# Patient Record
Sex: Male | Born: 1935 | Race: White | Hispanic: No | Marital: Married | Smoking: Former smoker
Health system: Southern US, Community
[De-identification: ages and names within clinical notes are randomized; demographics above are authoritative.]

## PROBLEM LIST (undated history)

## (undated) DIAGNOSIS — E119 Type 2 diabetes mellitus without complications: Secondary | ICD-10-CM

## (undated) DIAGNOSIS — I350 Nonrheumatic aortic (valve) stenosis: Secondary | ICD-10-CM

## (undated) DIAGNOSIS — M109 Gout, unspecified: Secondary | ICD-10-CM

## (undated) DIAGNOSIS — K219 Gastro-esophageal reflux disease without esophagitis: Secondary | ICD-10-CM

## (undated) DIAGNOSIS — I10 Essential (primary) hypertension: Secondary | ICD-10-CM

## (undated) HISTORY — DX: Gout, unspecified: M10.9

## (undated) HISTORY — PX: JOINT REPLACEMENT: SHX530

## (undated) HISTORY — DX: Essential (primary) hypertension: I10

## (undated) HISTORY — DX: Gastro-esophageal reflux disease without esophagitis: K21.9

## (undated) HISTORY — DX: Nonrheumatic aortic (valve) stenosis: I35.0

## (undated) HISTORY — DX: Type 2 diabetes mellitus without complications: E11.9

---

## 1998-01-08 ENCOUNTER — Encounter: Admission: RE | Admit: 1998-01-08 | Discharge: 1998-04-08 | Payer: Self-pay | Admitting: *Deleted

## 2009-09-27 ENCOUNTER — Ambulatory Visit (HOSPITAL_COMMUNITY): Admission: RE | Admit: 2009-09-27 | Discharge: 2009-09-27 | Payer: Self-pay | Admitting: Ophthalmology

## 2009-10-25 ENCOUNTER — Ambulatory Visit (HOSPITAL_COMMUNITY): Admission: RE | Admit: 2009-10-25 | Discharge: 2009-10-25 | Payer: Self-pay | Admitting: Ophthalmology

## 2010-05-18 LAB — CBC
HCT: 41.7 % (ref 39.0–52.0)
MCH: 33.4 pg (ref 26.0–34.0)
MCV: 98.3 fL (ref 78.0–100.0)
Platelets: 193 10*3/uL (ref 150–400)
RDW: 13.8 % (ref 11.5–15.5)
WBC: 6.6 10*3/uL (ref 4.0–10.5)

## 2010-05-18 LAB — BASIC METABOLIC PANEL
BUN: 23 mg/dL (ref 6–23)
CO2: 26 mEq/L (ref 19–32)
Chloride: 101 mEq/L (ref 96–112)
Creatinine, Ser: 1.18 mg/dL (ref 0.4–1.5)
Glucose, Bld: 170 mg/dL — ABNORMAL HIGH (ref 70–99)
Potassium: 3.8 mEq/L (ref 3.5–5.1)

## 2010-05-18 LAB — DIFFERENTIAL
Basophils Absolute: 0.1 10*3/uL (ref 0.0–0.1)
Eosinophils Absolute: 0.2 10*3/uL (ref 0.0–0.7)
Eosinophils Relative: 2 % (ref 0–5)
Lymphocytes Relative: 40 % (ref 12–46)
Lymphs Abs: 2.6 10*3/uL (ref 0.7–4.0)
Monocytes Absolute: 0.5 10*3/uL (ref 0.1–1.0)

## 2010-05-18 LAB — GLUCOSE, CAPILLARY: Glucose-Capillary: 130 mg/dL — ABNORMAL HIGH (ref 70–99)

## 2011-04-15 ENCOUNTER — Other Ambulatory Visit: Payer: Self-pay

## 2011-11-24 ENCOUNTER — Encounter: Payer: Self-pay | Admitting: Internal Medicine

## 2011-12-04 ENCOUNTER — Encounter: Payer: Self-pay | Admitting: *Deleted

## 2011-12-15 ENCOUNTER — Ambulatory Visit (INDEPENDENT_AMBULATORY_CARE_PROVIDER_SITE_OTHER): Payer: Medicare Other | Admitting: Cardiology

## 2011-12-15 ENCOUNTER — Encounter: Payer: Self-pay | Admitting: Cardiology

## 2011-12-15 ENCOUNTER — Encounter: Payer: Self-pay | Admitting: *Deleted

## 2011-12-15 VITALS — BP 153/80 | HR 57 | Ht 70.0 in | Wt 210.0 lb

## 2011-12-15 DIAGNOSIS — I429 Cardiomyopathy, unspecified: Secondary | ICD-10-CM

## 2011-12-15 DIAGNOSIS — I10 Essential (primary) hypertension: Secondary | ICD-10-CM | POA: Insufficient documentation

## 2011-12-15 DIAGNOSIS — I350 Nonrheumatic aortic (valve) stenosis: Secondary | ICD-10-CM

## 2011-12-15 DIAGNOSIS — I359 Nonrheumatic aortic valve disorder, unspecified: Secondary | ICD-10-CM

## 2011-12-15 DIAGNOSIS — I428 Other cardiomyopathies: Secondary | ICD-10-CM

## 2011-12-15 MED ORDER — CARVEDILOL 12.5 MG PO TABS: 12.5000 mg | ORAL_TABLET | Freq: Two times a day (BID) | ORAL | Status: DC

## 2011-12-15 NOTE — Patient Instructions (Addendum)
   TEE - patient to call back after discussing with son   Stop Metoprolol  Change to Coreg 12.5mg  twice a day   Follow up in  2 weeks

## 2011-12-15 NOTE — Progress Notes (Signed)
Patient ID: NASEEM VARDEN, male   DOB: October 31, 1930, 78 y.o.   MRN: [id number] PCP: Dr. Sherryll Burger  78 yo with history of DM, HTN, and hyperlipidemia presents for evaluation of aortic stenosis.  Patient had an echo at PCP's office in 9/13 with question of severe AS and possibly decreased LV systolic function.  I do not have the images available to review.  AS is described as visually severe but calculated valve area was 1.2 cm^2 and mean gradient was 17 mmHg only.  Patient is not sure why he had the echo done but it may have been because of aortic-area murmur.    Mr Schappell main complaint is that he has felt "sick" for a long time.  He cannot describe this symptom well, but it seems like he has had nausea on and off.  He denies exertional dyspnea or chest pain.  He can climb a flight of steps without problems and has no problems walking on flat ground.  He does not, however, seem to be particularly active.  No lightheadedness or syncope.  No palpitations.  He has been told for a while that he has a heart murmur.  He has no prior history of MI, etc.   Labs (2/13): K 3.6, creatinine 1.26, LDL 46, HDL 34, HCT 44.5  ECG: NSR, old inferior MI, left axis deviation, LAE  PMH: 1. Aortic stenosis: Echo in 9/13 at outside facility showed EF 35-45%, AS described as severe visually but AVA 1.2 cm^2 and mean gradient only 17 mmHg.  2. Obesity.  3. Gout 4. HTN 5. Type II diabetes 6. GERD  FH: no cardiac problems that he knows of.   SH: Lives in [place].  Nonsmoker, married, retired from [employer].   ROS: All systems reviewed and negative except as per HPI.   Current Outpatient Prescriptions  Medication Sig Dispense Refill  . allopurinol (ZYLOPRIM) 300 MG tablet Take 300 mg by mouth daily.      Marland Kitchen ALPRAZolam (XANAX) 0.5 MG tablet Take 0.5 mg by mouth 2 (two) times daily as needed.      Marland Kitchen amLODipine (NORVASC) 10 MG tablet Take 10 mg by mouth daily.      Marland Kitchen aspirin EC 81 MG tablet Take 81 mg by mouth daily.      Marland Kitchen  gabapentin (NEURONTIN) 100 MG capsule Take 100 mg by mouth 3 (three) times daily.      Marland Kitchen glimepiride (AMARYL) 2 MG tablet Take 2 mg by mouth 2 (two) times daily.      . hydrochlorothiazide (HYDRODIURIL) 25 MG tablet Take 25 mg by mouth daily.      Marland Kitchen lisinopril (PRINIVIL,ZESTRIL) 20 MG tablet Take 20 mg by mouth daily.      . metFORMIN (GLUCOPHAGE) 500 MG tablet Take 1,000 mg by mouth 2 (two) times daily with a meal.       . Misc Natural Products (OSTEO BI-FLEX ADV DOUBLE ST PO) Take 1 tablet by mouth 2 (two) times daily.      Marland Kitchen omeprazole (PRILOSEC) 20 MG capsule Take 1 capsule by mouth Daily.      . pravastatin (PRAVACHOL) 40 MG tablet Take 40 mg by mouth daily.      . carvedilol (COREG) 12.5 MG tablet Take 1 tablet (12.5 mg total) by mouth 2 (two) times daily.  60 tablet  6    BP 153/80  Pulse 57  Ht 5\' 10"  (1.778 m)  Wt 210 lb (95.255 kg)  BMI 30.13 kg/m2 General: NAD Neck:  No JVD, no thyromegaly or thyroid nodule.  Lungs: Clear to auscultation bilaterally with normal respiratory effort. CV: Nondisplaced PMI.  Heart regular S1/S2, no S3/S4, 3/6 crescendo-decrescendo systolic murmur RUSB, can hear S2 with slight muffling.  Trace ankle edema.  No carotid bruit.  Normal pedal pulses.  Abdomen: Soft, nontender, no hepatosplenomegaly, no distention.  Skin: Intact without lesions or rashes.  Neurologic: Alert and oriented x 3.  Psych: Normal affect. Extremities: No clubbing or cyanosis.  HEENT: Normal.   Assessment/Plan:  1. Aortic stenosis: Echo done at PCP's office.  I do not have images available.  Per report, seems somewhat equivocal with severe stenosis visually but doppler data does not seem to correspond.  He has a significant murmur on exam.  He does not report any particularly marked exertional symptoms, but I do not think that he is particularly active.  I think that we need to investigate the AS more closely.  I will arrange for a TEE with possible additional TTE images to see if  AS is truly severe.  2. Cardiomyopathy: EF 35-45% by report from outside echo.  Will reassess this with TEE and possible additional TTE images.  He does not report ischemic symptoms.  He does not appear significantly volume overloaded on exam.  If EF is indeed depressed, it could be due to severe AS (if AS is indeed severe), but also could be due to CAD.  He will need an ischemic workup.  Will wait for TEE and further AS workup before deciding on cath versus myoview.  - Continue ACEI (lisinopril).  - Stop metoprolol and instead use Coreg 12.5 mg bid (should be better for BP also).   3. HTN: As above, replacing metoprolol with Coreg.   Marca Ancona 12/15/2011 4:04 PM

## 2012-01-14 ENCOUNTER — Encounter: Payer: Self-pay | Admitting: *Deleted

## 2012-01-14 ENCOUNTER — Telehealth: Payer: Self-pay | Admitting: *Deleted

## 2012-01-14 ENCOUNTER — Telehealth: Payer: Self-pay

## 2012-01-14 NOTE — Telephone Encounter (Signed)
Pt has [employer], no precert for TEE

## 2012-01-14 NOTE — Progress Notes (Signed)
Patient ID: Shaun Whitney, male   DOB: 01-22-1931, 78 y.o.   MRN: [id number]   TEE - scheduled for Thursday, November 14 at 9:00 with Dr. Elease Hashimoto.  Patient to arrive to Short Stay at [facility] at 8:00.     Booking Number - [id number].

## 2012-01-14 NOTE — Telephone Encounter (Signed)
TEE scheduled for Thursday, November 14 at 9:00 am with Dr. Elease Hashimoto.  Patient to arrive to Short Stay at [facility] at 8:00 am.    Multiple attempts made to schedule with Dr. Shirlee Latch, but unsuccessful due to patient working out transportation with son & his work schedule & trying to coordinate with MD schedule.    Patient aware of above with instructions given.

## 2012-01-14 NOTE — Telephone Encounter (Signed)
I am in the hospital tomorrow (11/14).  I can do the TEE.

## 2012-01-14 NOTE — Telephone Encounter (Signed)
TEE scheduled for tomorrow, Thursday, 11/14 at 9:00 - [facility] - Dr. Elease Hashimoto.

## 2012-01-14 NOTE — Telephone Encounter (Signed)
Message copied by Lesle Chris on Wed Jan 14, 2012 11:29 AM ------      Message from: Laurey Morale      Created: Fri Dec 19, 2011  5:37 PM       Could have done it at lunch but scheduled a meeting.  How about Friday when I am in [place]?       [place]      ----- Message -----         From: Lesle Chris, LPN         Sent: 12/19/2011   4:22 PM           To: Laurey Morale, MD            Patient last seen on 10/14 - just called back with date his son could take him to [facility] for his TEE.  He is asking for Tuesday, 10/22.  Doctors schedule has you down for [employer] Rounds.              Not sure how that affects your schedule.  Is this a clinic schedule or hospital time where you could fit in procedure for the evening?            Thanks,            Inocencio Homes

## 2012-01-15 ENCOUNTER — Ambulatory Visit (HOSPITAL_COMMUNITY)
Admission: RE | Admit: 2012-01-15 | Discharge: 2012-01-15 | Disposition: A | Discharge status: Home / Self Care | Payer: Medicare Other | Source: Ambulatory Visit | Attending: Cardiovascular Disease | Admitting: Cardiovascular Disease

## 2012-01-15 ENCOUNTER — Encounter (HOSPITAL_COMMUNITY)
Admission: RE | Disposition: A | Discharge status: Home / Self Care | Payer: Self-pay | Source: Ambulatory Visit | Attending: Cardiovascular Disease

## 2012-01-15 ENCOUNTER — Encounter (HOSPITAL_COMMUNITY): Payer: Self-pay

## 2012-01-15 DIAGNOSIS — I359 Nonrheumatic aortic valve disorder, unspecified: Secondary | ICD-10-CM

## 2012-01-15 DIAGNOSIS — I350 Nonrheumatic aortic (valve) stenosis: Secondary | ICD-10-CM

## 2012-01-15 HISTORY — PX: TEE WITHOUT CARDIOVERSION: SHX5443

## 2012-01-15 LAB — GLUCOSE, CAPILLARY: Glucose-Capillary: 143 mg/dL — ABNORMAL HIGH (ref 70–99)

## 2012-01-15 SURGERY — ECHOCARDIOGRAM, TRANSESOPHAGEAL
Anesthesia: Moderate Sedation

## 2012-01-15 MED ORDER — HYDRALAZINE HCL 20 MG/ML IJ SOLN
INTRAMUSCULAR | Status: DC | PRN
  Administered 2012-01-15: 20 mg via INTRAVENOUS

## 2012-01-15 MED ORDER — AMLODIPINE BESYLATE 10 MG PO TABS
10.0000 mg | ORAL_TABLET | Freq: Every day | ORAL | Status: DC
  Administered 2012-01-15: 10 mg via ORAL
  Filled 2012-01-15 (×2): qty 1

## 2012-01-15 MED ORDER — LISINOPRIL 20 MG PO TABS
20.0000 mg | ORAL_TABLET | Freq: Once | ORAL | Status: AC
  Administered 2012-01-15: 20 mg via ORAL
  Filled 2012-01-15 (×2): qty 1

## 2012-01-15 MED ORDER — METOPROLOL TARTRATE 1 MG/ML IV SOLN
5.0000 mg | Freq: Once | INTRAVENOUS | Status: AC
  Administered 2012-01-15: 5 mg via INTRAVENOUS

## 2012-01-15 MED ORDER — FENTANYL CITRATE 0.05 MG/ML IJ SOLN
INTRAMUSCULAR | Status: DC | PRN
  Administered 2012-01-15 (×2): 25 ug via INTRAVENOUS

## 2012-01-15 MED ORDER — BUTAMBEN-TETRACAINE-BENZOCAINE 2-2-14 % EX AERO
INHALATION_SPRAY | CUTANEOUS | Status: DC | PRN
  Administered 2012-01-15: 2 via TOPICAL

## 2012-01-15 MED ORDER — METOPROLOL TARTRATE 1 MG/ML IV SOLN
INTRAVENOUS | Status: AC
  Filled 2012-01-15: qty 5

## 2012-01-15 MED ORDER — SODIUM CHLORIDE 0.9 % IV SOLN
INTRAVENOUS | Status: DC
  Administered 2012-01-15: 500 mL via INTRAVENOUS

## 2012-01-15 MED ORDER — MIDAZOLAM HCL 10 MG/2ML IJ SOLN
INTRAMUSCULAR | Status: DC | PRN
  Administered 2012-01-15: 1 mg via INTRAVENOUS
  Administered 2012-01-15: 2 mg via INTRAVENOUS

## 2012-01-15 MED ORDER — FENTANYL CITRATE 0.05 MG/ML IJ SOLN
INTRAMUSCULAR | Status: AC
  Filled 2012-01-15: qty 2

## 2012-01-15 MED ORDER — CARVEDILOL 12.5 MG PO TABS
12.5000 mg | ORAL_TABLET | Freq: Once | ORAL | Status: AC
  Administered 2012-01-15: 12.5 mg via ORAL
  Filled 2012-01-15 (×2): qty 1

## 2012-01-15 MED ORDER — METOPROLOL TARTRATE 1 MG/ML IV SOLN
INTRAVENOUS | Status: DC | PRN
  Administered 2012-01-15: 5 mg via INTRAVENOUS

## 2012-01-15 MED ORDER — HYDRALAZINE HCL 20 MG/ML IJ SOLN
INTRAMUSCULAR | Status: AC
  Filled 2012-01-15: qty 1

## 2012-01-15 MED ORDER — MIDAZOLAM HCL 5 MG/ML IJ SOLN
INTRAMUSCULAR | Status: AC
  Filled 2012-01-15: qty 2

## 2012-01-15 NOTE — H&P (Signed)
   Patient arrived today for TEE.  No change to history or physical compared to note from 12/15/11.   Marca Ancona 01/15/2012 9:19 AM

## 2012-01-15 NOTE — Progress Notes (Signed)
Echocardiogram Echocardiogram Transesophageal has been performed.  Shaun Whitney 01/15/2012, 10:33 AM

## 2012-01-15 NOTE — CV Procedure (Addendum)
Procedure: TEE  Indication: Aortic stenosis  Sedation: Versed 3 mg IV, Fentanyl 50 mcg IV  Findings: Please see echo section of chart for full report.  Normal LV size with mild to moderate LV hypertrophy.  EF 55% with no definite wall motion abnormalities.  By valve area and mean gradient, there was mild aortic stenosis.  Visually, stenosis looked a bit worse, perhaps moderate range.   The valve was trileaflet.   No complications.   I will arrange a Lexiscan myoview to further assess his exercise intolerance.   Marca Ancona 01/15/2012 9:59 AM

## 2012-01-16 ENCOUNTER — Encounter (HOSPITAL_COMMUNITY): Payer: Self-pay | Admitting: Cardiology

## 2012-01-21 ENCOUNTER — Telehealth: Payer: Self-pay | Admitting: *Deleted

## 2012-01-21 NOTE — Telephone Encounter (Signed)
Left message with male at residence.  Stated he was outside - getting ready to go to appointment with Dr. Sherryll Burger.  She will have him return call.

## 2012-01-21 NOTE — Telephone Encounter (Signed)
Message copied by Lesle Chris on Wed Jan 21, 2012  9:50 AM ------      Message from: Laurey Morale      Created: Thu Jan 15, 2012  9:59 AM       Mr Crumble needs a Steffanie Dunn for exertional dyspnea.  Please arrange for this.  Can do in [place] if he can make it here, otherwise Ambulance person (prefer [place]).       Thanks.

## 2012-02-02 NOTE — Telephone Encounter (Signed)
Attempted to reach patient again - spoke with wife Shaun Whitney) - states husband has gone to drug store.  Stated she did give him the message before & will talk with him when he returns.  Will have him call back if return before 5:00 today or tomorrow morning.

## 2012-02-09 NOTE — Telephone Encounter (Signed)
Discussed below with patient.  States he does not want to do this test now.  Explained to patient the reason MD wanted to do this test, but he still declines at this time.  Advised patient that I will inform Dr. Shirlee Latch and see when he needs to be seen back in office for follow up.  Patient verbalized understanding.

## 2012-02-09 NOTE — Telephone Encounter (Signed)
Have him return in 2-3 weeks if symptoms remain stable.

## 2012-02-11 NOTE — Telephone Encounter (Signed)
Discussed below with patient.  Scheduled OV with Dr. Shirlee Latch tomorrow as MD not here within time frame suggested below.

## 2012-02-12 ENCOUNTER — Encounter: Payer: Self-pay | Admitting: Cardiology

## 2012-02-12 ENCOUNTER — Ambulatory Visit (INDEPENDENT_AMBULATORY_CARE_PROVIDER_SITE_OTHER): Payer: Medicare Other | Admitting: Cardiology

## 2012-02-12 VITALS — BP 153/68 | HR 62 | Ht 68.0 in | Wt 218.0 lb

## 2012-02-12 DIAGNOSIS — E785 Hyperlipidemia, unspecified: Secondary | ICD-10-CM

## 2012-02-12 DIAGNOSIS — R0602 Shortness of breath: Secondary | ICD-10-CM

## 2012-02-12 DIAGNOSIS — I350 Nonrheumatic aortic (valve) stenosis: Secondary | ICD-10-CM

## 2012-02-12 DIAGNOSIS — I10 Essential (primary) hypertension: Secondary | ICD-10-CM

## 2012-02-12 DIAGNOSIS — I359 Nonrheumatic aortic valve disorder, unspecified: Secondary | ICD-10-CM

## 2012-02-12 MED ORDER — LISINOPRIL 40 MG PO TABS: 40.0000 mg | ORAL_TABLET | Freq: Every day | ORAL | Status: AC

## 2012-02-12 NOTE — Patient Instructions (Addendum)
Your physician recommends that you schedule a follow-up appointment in: 6 months. You will receive a reminder letter in the mail in about 4 months reminding you to call and schedule your appointment. If you don't receive this letter, please contact our office.  Your physician has recommended you make the following change in your medication: Increased lisinopril to 40 mg daily. Please take (2) of your 20 mg tablets daily until they are finished. Your new prescription has been sent to your pharmacy. All other medications will remain the same. Your physician recommends that you return for a FASTING lipid profile and BMET in 2 weeks around 02/26/12 at [facility]. Please don't eat or drink after midnight. Your physician has requested that you have an echocardiogram in 1 year. Echocardiography is a painless test that uses sound waves to create images of your heart. It provides your doctor with information about the size and shape of your heart and how well your heart's chambers and valves are working. This procedure takes approximately one hour. There are no restrictions for this procedure.

## 2012-02-12 NOTE — Progress Notes (Signed)
Patient ID: Shaun Whitney, male   DOB: August 16, 1930, 78 y.o.   MRN: [id number] PCP: Dr. Sherryll Burger  78 yo with history of DM, HTN, and hyperlipidemia presented initially for evaluation of aortic stenosis.  Patient had an echo at PCP's office in 9/13 with question of severe AS and possibly decreased LV systolic function.  I do not have the images available to review.  AS is described as visually severe but calculated valve area was 1.2 cm^2 and mean gradient was 17 mmHg only.  EF was described as 35-45%.  Patient is not sure why he had the echo done but it may have been because of aortic-area murmur.    Mr Gartin main complaint at last appointment is that he has felt "sick" for a long time.  He could not describe this symptom well, but it seems like he has had nausea on and off.  He denies exertional dyspnea or chest pain.  He can climb a flight of steps without problems and has no problems walking on flat ground.  He does not, however, seem to be particularly active.  No lightheadedness or syncope.  No palpitations.  He has been told for a while that he has a heart murmur.  He has no prior history of MI, etc.   I did a TEE in 11/13.  This showed normal EF (55%) with mild AS by valve area and mean gradient and perhaps moderate AS visually.  Today, he actually says that the "sick" feeling has improved.  Still no exertional symptoms.  BP continues to run high.   Labs (2/13): K 3.6, creatinine 1.26, LDL 46, HDL 34, HCT 44.5  ECG: NSR, old inferior MI, left axis deviation, LAE  PMH: 1. Aortic stenosis: Echo in 9/13 at outside facility showed EF 35-45%, AS described as severe visually but AVA 1.2 cm^2 and mean gradient only 17 mmHg.  TEE (11/13) with EF 55%, mild-moderate LVH, mild AS by gradient and valve area (mean 18 mmHg, AVA 1.74 cm^2) but moderate visually.  2. Obesity.  3. Gout 4. HTN 5. Type II diabetes 6. GERD  FH: no cardiac problems that he knows of.   SH: Lives in [place].  Nonsmoker, married,  retired from [employer].   ROS: All systems reviewed and negative except as per HPI.   Current Outpatient Prescriptions  Medication Sig Dispense Refill  . allopurinol (ZYLOPRIM) 300 MG tablet Take 300 mg by mouth daily.      Marland Kitchen ALPRAZolam (XANAX) 0.5 MG tablet Take 0.5 mg by mouth 2 (two) times daily as needed.      Marland Kitchen amLODipine (NORVASC) 10 MG tablet Take 10 mg by mouth daily.      Marland Kitchen aspirin EC 81 MG tablet Take 81 mg by mouth daily.      . carvedilol (COREG) 12.5 MG tablet Take 1 tablet (12.5 mg total) by mouth 2 (two) times daily.  60 tablet  6  . gabapentin (NEURONTIN) 100 MG capsule Take 100 mg by mouth 3 (three) times daily.      Marland Kitchen glimepiride (AMARYL) 2 MG tablet Take 2 mg by mouth 2 (two) times daily.      . hydrochlorothiazide (HYDRODIURIL) 25 MG tablet Take 25 mg by mouth daily.      . metFORMIN (GLUCOPHAGE) 500 MG tablet Take 1,000 mg by mouth 2 (two) times daily with a meal.       . Misc Natural Products (OSTEO BI-FLEX ADV DOUBLE ST PO) Take 1 tablet by mouth 2 (  two) times daily.      Marland Kitchen omeprazole (PRILOSEC) 20 MG capsule Take 1 capsule by mouth Daily.      . pravastatin (PRAVACHOL) 40 MG tablet Take 40 mg by mouth daily.      Marland Kitchen lisinopril (PRINIVIL,ZESTRIL) 40 MG tablet Take 1 tablet (40 mg total) by mouth daily.  90 tablet  3      BP 153/68  Pulse 62  Ht 5\' 8"  (1.727 m)  Wt 218 lb (98.884 kg)  BMI 33.15 kg/m2 General: NAD Neck: No JVD, no thyromegaly or thyroid nodule.  Lungs: Clear to auscultation bilaterally with normal respiratory effort. CV: Nondisplaced PMI.  Heart regular S1/S2, no S3/S4, 3/6 crescendo-decrescendo systolic murmur RUSB, can hear S2 with slight muffling.  Trace ankle edema.  No carotid bruit.  Normal pedal pulses.  Abdomen: Soft, nontender, no hepatosplenomegaly, no distention.  Skin: Intact without lesions or rashes.  Neurologic: Alert and oriented x 3.  Psych: Normal affect. Extremities: No clubbing or cyanosis.  HEENT: Normal.    Assessment/Plan:  1. Aortic stenosis: Mild to moderate AS confirmed by TEE.  No symptoms suggestive of worse AS.  Continue to monitor, will likely repeat echo in a year.   2. Cardiomyopathy: EF 35-45% by report from outside echo.  However, by TEE EF appeared normal.   3. HTN: BP still high.  Increase lisinopril to 40 mg daily. BMET in 2 wks, will also check lipids.   Marca Ancona 02/15/2012

## 2012-03-01 ENCOUNTER — Encounter: Payer: Self-pay | Admitting: Physician Assistant

## 2012-03-01 ENCOUNTER — Ambulatory Visit (INDEPENDENT_AMBULATORY_CARE_PROVIDER_SITE_OTHER): Payer: Medicare Other | Admitting: Physician Assistant

## 2012-03-01 VITALS — BP 158/80 | HR 72 | Ht 69.0 in | Wt 230.0 lb

## 2012-03-01 DIAGNOSIS — R0600 Dyspnea, unspecified: Secondary | ICD-10-CM | POA: Insufficient documentation

## 2012-03-01 DIAGNOSIS — R0602 Shortness of breath: Secondary | ICD-10-CM

## 2012-03-01 DIAGNOSIS — I10 Essential (primary) hypertension: Secondary | ICD-10-CM

## 2012-03-01 DIAGNOSIS — R0989 Other specified symptoms and signs involving the circulatory and respiratory systems: Secondary | ICD-10-CM

## 2012-03-01 DIAGNOSIS — I350 Nonrheumatic aortic (valve) stenosis: Secondary | ICD-10-CM

## 2012-03-01 DIAGNOSIS — I359 Nonrheumatic aortic valve disorder, unspecified: Secondary | ICD-10-CM

## 2012-03-01 DIAGNOSIS — Z79899 Other long term (current) drug therapy: Secondary | ICD-10-CM

## 2012-03-01 MED ORDER — FUROSEMIDE 40 MG PO TABS: 40.0000 mg | ORAL_TABLET | Freq: Every day | ORAL | Status: DC

## 2012-03-01 NOTE — Progress Notes (Signed)
Primary Cardiologist: Marca Ancona, MD   HPI: Shaun Whitney walked into office this a.m. with SOB, now seen as an add-on.  Shaun Whitney states that he was in his USOH until early Sunday morning, when he awoke SOB. He has remained SOB with significant DOE, since then. He denies any CP or palpitations. He has gained 12 pounds since last OV. He does admit to drinking a lot of fluids, and his wife does use salt in preparing his meals.   12-lead EKG today indicates NSR 72 bpm with poor R-wave progression; no acute changes  No Known Allergies  Current Outpatient Prescriptions  Medication Sig Dispense Refill  . allopurinol (ZYLOPRIM) 300 MG tablet Take 300 mg by mouth daily.      Marland Kitchen ALPRAZolam (XANAX) 0.5 MG tablet Take 0.5 mg by mouth 2 (two) times daily as needed.      Marland Kitchen amLODipine (NORVASC) 10 MG tablet Take 10 mg by mouth daily.      Marland Kitchen aspirin EC 81 MG tablet Take 81 mg by mouth daily.      . carvedilol (COREG) 12.5 MG tablet Take 1 tablet (12.5 mg total) by mouth 2 (two) times daily.  60 tablet  6  . gabapentin (NEURONTIN) 100 MG capsule Take 100 mg by mouth 3 (three) times daily.      Marland Kitchen glimepiride (AMARYL) 2 MG tablet Take 2 mg by mouth 2 (two) times daily.      . hydrochlorothiazide (HYDRODIURIL) 25 MG tablet Take 25 mg by mouth daily.      Marland Kitchen lisinopril (PRINIVIL,ZESTRIL) 40 MG tablet Take 1 tablet (40 mg total) by mouth daily.  90 tablet  3  . metFORMIN (GLUCOPHAGE) 500 MG tablet Take 1,000 mg by mouth 2 (two) times daily with a meal.       . Misc Natural Products (OSTEO BI-FLEX ADV DOUBLE ST PO) Take 1 tablet by mouth 2 (two) times daily.      Marland Kitchen omeprazole (PRILOSEC) 20 MG capsule Take 1 capsule by mouth Daily.      . pravastatin (PRAVACHOL) 40 MG tablet Take 40 mg by mouth daily.      . furosemide (LASIX) 40 MG tablet Take 1 tablet (40 mg total) by mouth daily.  30 tablet  6    Past Medical History  Diagnosis Date  . Aortic stenosis   . Diabetes mellitus type II   . Hypertension   . Gout    . GERD (gastroesophageal reflux disease)     Past Surgical History  Procedure Date  . Joint replacement     right knee  . Tee without cardioversion 01/15/2012    Procedure: TRANSESOPHAGEAL ECHOCARDIOGRAM (TEE);  Surgeon: Laurey Morale, MD;  Location: [facility] ENDOSCOPY;  Service: Cardiovascular;  Laterality: N/A;    History   Social History  . Marital Status: Married    Spouse Name: N/A    Number of Children: N/A  . Years of Education: N/A   Occupational History  . Not on file.   Social History Main Topics  . Smoking status: Former Smoker -- 1.0 packs/day for 30 years    Types: Cigarettes    Start date: 03/04/1951    Quit date: 03/03/1981  . Smokeless tobacco: Never Used  . Alcohol Use: No  . Drug Use: No  . Sexually Active: Not on file   Other Topics Concern  . Not on file   Social History Narrative  . No narrative on file    No family history on file.  ROS: no nausea, vomiting; no fever, chills; no melena, hematochezia; no claudication  PHYSICAL EXAM: BP 158/80  Pulse 72  Ht 5\' 9"  (1.753 m)  Wt 230 lb (104.327 kg)  BMI 33.96 kg/m2  SpO2 94% GENERAL: 78 year old male, obese; NAD HEENT: NCAT, PERRLA, EOMI; sclera clear; no xanthelasma NECK: palpable bilateral carotid pulses, no bruits; no JVD; no TM LUNGS: Diminished breath sounds, no crackles/wheezes CARDIAC: RRR (S1, S2); no significant murmurs; no rubs or gallops ABDOMEN: soft, non-tender; intact BS EXTREMETIES: 1+ bilateral peripheral edema SKIN: warm/dry; no obvious rash/lesions MUSCULOSKELETAL: no joint deformity NEURO: no focal deficit; NL affect   EKG: reviewed and available in Electronic Records   ASSESSMENT & PLAN:  Dyspnea I suspect Shaun Whitney's symptoms are due to some degree of DHF, in the context of significant volume overload since last OV. Will initiate therapy with Lasix 40 mg daily, check CMET, including BNP level, and a 2v CXR. Plan have Shaun Whitney return to see me in the next few days  for early followup and further recommendations. Of note, Shaun Whitney has normalized LVF by recent TEE, performed by Dr. Shirlee Latch.  HTN (hypertension) Remains uncontrolled, despite recent doubling of ACE inhibitor.  Aortic stenosis Assessed as mild/moderate by recent TEE.    Gene Milos Milligan, PAC

## 2012-03-01 NOTE — Assessment & Plan Note (Signed)
I suspect patient's symptoms are due to some degree of DHF, in the context of significant volume overload since last OV. Will initiate therapy with Lasix 40 mg daily, check CMET, including BNP level, and a 2v CXR. Plan have patient return to see me in the next few days for early followup and further recommendations. Of note, patient has normalized LVF by recent TEE, performed by Dr. Shirlee Latch.

## 2012-03-01 NOTE — Assessment & Plan Note (Signed)
Remains uncontrolled, despite recent doubling of ACE inhibitor.

## 2012-03-01 NOTE — Assessment & Plan Note (Signed)
Assessed as mild/moderate by recent TEE.

## 2012-03-01 NOTE — Patient Instructions (Signed)
   Begin Lasix 40mg  daily  Labs & x-ray today - CMET, BNP & chest x-ray  Continue all other current medications. Recheck in office on Thursday

## 2012-03-04 ENCOUNTER — Ambulatory Visit (INDEPENDENT_AMBULATORY_CARE_PROVIDER_SITE_OTHER): Payer: Medicare Other | Admitting: Physician Assistant

## 2012-03-04 ENCOUNTER — Encounter: Payer: Self-pay | Admitting: Physician Assistant

## 2012-03-04 VITALS — BP 135/58 | HR 57 | Ht 69.0 in | Wt 226.0 lb

## 2012-03-04 DIAGNOSIS — R0600 Dyspnea, unspecified: Secondary | ICD-10-CM

## 2012-03-04 DIAGNOSIS — Z79899 Other long term (current) drug therapy: Secondary | ICD-10-CM

## 2012-03-04 DIAGNOSIS — R0989 Other specified symptoms and signs involving the circulatory and respiratory systems: Secondary | ICD-10-CM

## 2012-03-04 DIAGNOSIS — I5032 Chronic diastolic (congestive) heart failure: Secondary | ICD-10-CM

## 2012-03-04 DIAGNOSIS — R0609 Other forms of dyspnea: Secondary | ICD-10-CM

## 2012-03-04 DIAGNOSIS — I10 Essential (primary) hypertension: Secondary | ICD-10-CM

## 2012-03-04 NOTE — Progress Notes (Signed)
Primary Cardiologist: Marca Ancona, MD   HPI: Scheduled early followup, for ongoing close monitoring/management of diastolic heart failure.  At time of recent OV, at which time patient was seen as an add-on, I initiated diuretic therapy with Lasix 40 daily and ordered baseline labs and CXR. Results, as follows:   - BUN 21, creatinine 1.2 procedure Barkett D.) 6, potassium 4.0. BNP 310.   - CXR: Worsening interstitial edema, trace pleural effusions  He returns today reporting he feels "better". His breathing is much improved, and he has been able to sleep these past 2 nights, with no symptoms suggestive of orthopnea or PND. Although he does not weigh himself daily, he is observing sodium restriction. He has diuresed 4 pounds since his initial visit 3 days ago.  No Known Allergies  Current Outpatient Prescriptions  Medication Sig Dispense Refill  . allopurinol (ZYLOPRIM) 300 MG tablet Take 300 mg by mouth daily.      Marland Kitchen ALPRAZolam (XANAX) 0.5 MG tablet Take 0.5 mg by mouth 2 (two) times daily as needed.      Marland Kitchen amLODipine (NORVASC) 10 MG tablet Take 10 mg by mouth daily.      Marland Kitchen aspirin EC 81 MG tablet Take 81 mg by mouth daily.      . carvedilol (COREG) 12.5 MG tablet Take 1 tablet (12.5 mg total) by mouth 2 (two) times daily.  60 tablet  6  . furosemide (LASIX) 40 MG tablet Take 1 tablet (40 mg total) by mouth daily.  30 tablet  6  . gabapentin (NEURONTIN) 100 MG capsule Take 100 mg by mouth 3 (three) times daily.      Marland Kitchen glimepiride (AMARYL) 2 MG tablet Take 2 mg by mouth 2 (two) times daily.      . hydrochlorothiazide (HYDRODIURIL) 25 MG tablet Take 25 mg by mouth daily.      Marland Kitchen lisinopril (PRINIVIL,ZESTRIL) 40 MG tablet Take 1 tablet (40 mg total) by mouth daily.  90 tablet  3  . metFORMIN (GLUCOPHAGE) 500 MG tablet Take 1,000 mg by mouth 2 (two) times daily with a meal.       . Misc Natural Products (OSTEO BI-FLEX ADV DOUBLE ST PO) Take 1 tablet by mouth 2 (two) times daily.      Marland Kitchen  omeprazole (PRILOSEC) 20 MG capsule Take 1 capsule by mouth Daily.      . pravastatin (PRAVACHOL) 40 MG tablet Take 40 mg by mouth daily.        Past Medical History  Diagnosis Date  . Aortic stenosis   . Diabetes mellitus type II   . Hypertension   . Gout   . GERD (gastroesophageal reflux disease)     Past Surgical History  Procedure Date  . Joint replacement     right knee  . Tee without cardioversion 01/15/2012    Procedure: TRANSESOPHAGEAL ECHOCARDIOGRAM (TEE);  Surgeon: Laurey Morale, MD;  Location: [facility] ENDOSCOPY;  Service: Cardiovascular;  Laterality: N/A;    History   Social History  . Marital Status: Married    Spouse Name: N/A    Number of Children: N/A  . Years of Education: N/A   Occupational History  . Not on file.   Social History Main Topics  . Smoking status: Former Smoker -- 1.0 packs/day for 30 years    Types: Cigarettes    Start date: 03/04/1951    Quit date: 03/03/1981  . Smokeless tobacco: Never Used  . Alcohol Use: No  . Drug Use: No  .  Sexually Active: Not on file   Other Topics Concern  . Not on file   Social History Narrative  . No narrative on file    No family history on file.  ROS: no nausea, vomiting; no fever, chills; no melena, hematochezia; no claudication  PHYSICAL EXAM: BP 135/58  Pulse 57  Ht 5\' 9"  (1.753 m)  Wt 226 lb (102.513 kg)  BMI 33.37 kg/m2  SpO2 94% GENERAL: 79 year old male, obese; NAD  HEENT: NCAT, PERRLA, EOMI; sclera clear; no xanthelasma  NECK: palpable bilateral carotid pulses, no bruits; no JVD; no TM  LUNGS: Diminished breath sounds, no crackles/wheezes  CARDIAC: RRR (S1, S2); no significant murmurs; no rubs or gallops  ABDOMEN: soft, non-tender; intact BS  EXTREMETIES: 1+ bilateral peripheral edema  SKIN: warm/dry; no obvious rash/lesions  MUSCULOSKELETAL: no joint deformity  NEURO: no focal deficit; NL affect    EKG:    ASSESSMENT & PLAN:  Dyspnea Much improved, following recent  addition of Lasix. Baseline labs stable, with followup labs to be drawn today, including repeat BNP level. As noted, baseline CXR did indicate interstitial edema. We'll need to continue following closely. Patient is concerned about frequent visits, secondary to financial constraints. Therefore, I will plan on seeing him back in one month, and plan to continue same dose Lasix, pending review of followup labs.  HTN (hypertension) Improvement since last OV. Continue current medication regimen.    Gene Suttyn Cryder, PAC

## 2012-03-04 NOTE — Patient Instructions (Signed)
   Continue all current medications.  Labs today for BMET, BNP   Office will contact with results

## 2012-03-04 NOTE — Assessment & Plan Note (Signed)
Improvement since last OV. Continue current medication regimen.

## 2012-03-04 NOTE — Assessment & Plan Note (Signed)
Much improved, following recent addition of Lasix. Baseline labs stable, with followup labs to be drawn today, including repeat BNP level. As noted, baseline CXR did indicate interstitial edema. We'll need to continue following closely. Patient is concerned about frequent visits, secondary to financial constraints. Therefore, I will plan on seeing him back in one month, and plan to continue same dose Lasix, pending review of followup labs.

## 2012-03-08 ENCOUNTER — Telehealth: Payer: Self-pay | Admitting: *Deleted

## 2012-03-08 DIAGNOSIS — Z79899 Other long term (current) drug therapy: Secondary | ICD-10-CM

## 2012-03-08 DIAGNOSIS — I10 Essential (primary) hypertension: Secondary | ICD-10-CM

## 2012-03-08 DIAGNOSIS — R7989 Other specified abnormal findings of blood chemistry: Secondary | ICD-10-CM

## 2012-03-08 NOTE — Telephone Encounter (Signed)
Message copied by Lesle Chris on Mon Mar 08, 2012 11:35 AM ------      Message from: Rande Brunt      Created: Fri Mar 05, 2012 12:45 PM       Creatinine up to 1.9 (1.2), but BNP down to <100 (310). K 3.4. Recommend continue current medication regimen, with f/u BMET in 5 days.

## 2012-03-08 NOTE — Telephone Encounter (Signed)
Notes Recorded by Lesle Chris, LPN on 06/02/3242 at 11:35 AM Patient notified. Will do repeat lab around 03/15/12. Will fax order to [facility] today.

## 2012-04-02 ENCOUNTER — Telehealth: Payer: Self-pay | Admitting: Cardiology

## 2012-04-02 ENCOUNTER — Encounter: Payer: Self-pay | Admitting: *Deleted

## 2012-04-02 ENCOUNTER — Encounter: Payer: Self-pay | Admitting: Cardiology

## 2012-04-02 ENCOUNTER — Ambulatory Visit (INDEPENDENT_AMBULATORY_CARE_PROVIDER_SITE_OTHER): Payer: Medicare Other | Admitting: Cardiology

## 2012-04-02 VITALS — BP 144/85 | HR 68 | Ht 69.0 in | Wt 212.0 lb

## 2012-04-02 DIAGNOSIS — I10 Essential (primary) hypertension: Secondary | ICD-10-CM

## 2012-04-02 DIAGNOSIS — I359 Nonrheumatic aortic valve disorder, unspecified: Secondary | ICD-10-CM

## 2012-04-02 DIAGNOSIS — I509 Heart failure, unspecified: Secondary | ICD-10-CM

## 2012-04-02 DIAGNOSIS — I5042 Chronic combined systolic (congestive) and diastolic (congestive) heart failure: Secondary | ICD-10-CM | POA: Insufficient documentation

## 2012-04-02 DIAGNOSIS — R0989 Other specified symptoms and signs involving the circulatory and respiratory systems: Secondary | ICD-10-CM

## 2012-04-02 DIAGNOSIS — R0600 Dyspnea, unspecified: Secondary | ICD-10-CM

## 2012-04-02 DIAGNOSIS — I5032 Chronic diastolic (congestive) heart failure: Secondary | ICD-10-CM

## 2012-04-02 DIAGNOSIS — I350 Nonrheumatic aortic (valve) stenosis: Secondary | ICD-10-CM

## 2012-04-02 MED ORDER — CARVEDILOL 12.5 MG PO TABS: 18.7500 mg | ORAL_TABLET | Freq: Two times a day (BID) | ORAL | Status: DC

## 2012-04-02 NOTE — Patient Instructions (Signed)
   Increase Coreg to 18.75mg  twice a day  (1 1/2 tabs of the 12.5mg  tablet twice a day) - new sent to pharm   Lab today for BMET  Treadmill Myoview (exercise stress test)  Office will contact with results Follow up in  2 months

## 2012-04-02 NOTE — Telephone Encounter (Signed)
Treadmill Myoview( exercise stress test) Weight 212  Tuesday, February 4th, 2014 @ [facility] Checking percert

## 2012-04-02 NOTE — Telephone Encounter (Signed)
Auth # [id number] exp 05/17/12

## 2012-04-02 NOTE — Progress Notes (Signed)
Patient ID: Shaun Whitney, male   DOB: 10-27-1930, 79 y.o.   MRN: [id number] PCP: Dr. Sherryll Burger  79 yo with history of DM, HTN, and hyperlipidemia presented initially for evaluation of aortic stenosis.  Patient had an echo at PCP's office in 9/13 with question of severe AS and possibly decreased LV systolic function.  I do not have the images available to review.  AS is described as visually severe but calculated valve area was 1.2 cm^2 and mean gradient was 17 mmHg only.  EF was described as 35-45%.  Patient is not sure why he had the echo done but it may have been because of aortic-area murmur.  I did a TEE in 11/13.  This showed normal EF (55%) with mild AS by valve area and mean gradient and perhaps moderate AS visually.    He returned to the office to see [name] on 03/01/12 due to the development of exertional dyspnea around Christmas.  He was felt to be volume overloaded on exam.  Lasix was started, and he has lost about 14 lbs total since that time.  He is breathing much better.  He denies exertional dyspnea (though he is not very active).  He does say that he can walk up a flight of steps without problems.  No PND or orthopnea.  He has not had any chest pain.  Main complaint now is knee pain.    Labs (2/13): K 3.6, creatinine 1.26, LDL 46, HDL 34, HCT 44.5 Labs (12/13): Creatinine 1.2, BNP 310, LDL 95, HDL 33 Labs (1/14): K 3.4, creatinine 1.87, BNP 97  PMH: 1. Aortic stenosis: Echo in 9/13 at outside facility showed EF 35-45%, AS described as severe visually but AVA 1.2 cm^2 and mean gradient only 17 mmHg.  TEE (11/13) with EF 55%, mild-moderate LVH, mild AS by gradient and valve area (mean 18 mmHg, AVA 1.74 cm^2) but moderate visually.  2. Obesity.  3. Gout 4. HTN 5. Type II diabetes 6. GERD 7. Diastolic CHF: EF 08% by TEE in 11/13.   FH: no cardiac problems that he knows of.   SH: Lives in [place].  Nonsmoker, married, retired from [employer].   ROS: All systems reviewed and negative  except as per HPI.   Current Outpatient Prescriptions  Medication Sig Dispense Refill  . allopurinol (ZYLOPRIM) 300 MG tablet Take 300 mg by mouth daily.      Marland Kitchen ALPRAZolam (XANAX) 0.5 MG tablet Take 0.5 mg by mouth 2 (two) times daily as needed.      Marland Kitchen amLODipine (NORVASC) 10 MG tablet Take 10 mg by mouth daily.      Marland Kitchen aspirin EC 81 MG tablet Take 81 mg by mouth daily.      . carvedilol (COREG) 12.5 MG tablet Take 1.5 tablets (18.75 mg total) by mouth 2 (two) times daily.  90 tablet  6  . furosemide (LASIX) 40 MG tablet Take 1 tablet (40 mg total) by mouth daily.  30 tablet  6  . gabapentin (NEURONTIN) 100 MG capsule Take 100 mg by mouth 3 (three) times daily.      Marland Kitchen glimepiride (AMARYL) 2 MG tablet Take 2 mg by mouth 2 (two) times daily.      . hydrochlorothiazide (HYDRODIURIL) 25 MG tablet Take 25 mg by mouth daily.      Marland Kitchen lisinopril (PRINIVIL,ZESTRIL) 40 MG tablet Take 1 tablet (40 mg total) by mouth daily.  90 tablet  3  . metFORMIN (GLUCOPHAGE) 500 MG tablet Take 1,000  mg by mouth 2 (two) times daily with a meal.       . Misc Natural Products (OSTEO BI-FLEX ADV DOUBLE ST PO) Take 1 tablet by mouth 2 (two) times daily.      Marland Kitchen omeprazole (PRILOSEC) 20 MG capsule Take 1 capsule by mouth Daily.      . pravastatin (PRAVACHOL) 40 MG tablet Take 40 mg by mouth daily.        BP 144/85  Pulse 68  Ht 5\' 9"  (1.753 m)  Wt 212 lb (96.163 kg)  BMI 31.31 kg/m2  SpO2 97% General: NAD Neck: No JVD, no thyromegaly or thyroid nodule.  Lungs: Clear to auscultation bilaterally with normal respiratory effort. CV: Nondisplaced PMI.  Heart regular S1/S2, no S3/S4, 3/6 crescendo-decrescendo systolic murmur RUSB, can hear S2 without muffling.  No edema.  No carotid bruit.  Normal pedal pulses.  Abdomen: Soft, nontender, no hepatosplenomegaly, no distention.  Neurologic: Alert and oriented x 3.  Psych: Normal affect. Extremities: No clubbing or cyanosis.   Assessment/Plan:  1. Aortic stenosis: Mild to  moderate AS confirmed by TEE.  Continue to monitor, will repeat echo in 11/14.   2. Diastolic CHF: EF 16-10% by report from outside echo in 9/13.  However, by TEE EF appeared normal (55% in 11/13).  He was recently evaluated by [name] with diastolic CHF and volume overload.  Lasix was started.  He is doing much better symptomatically, NYHA class II.  Weight is down a total of 14 lbs.  Creatinine did, however, rise with diuresis. - Continue current Lasix for now but check BMET today to make sure creatinine has not risen further. - Given CHF exacerbation/exertional dyspnea, will get ETT-myoview to assess for CAD as a contributor to his symptoms.  3. HTN: BP still high.  Increase Coreg to 18.75 mg bid.    Marca Ancona 04/02/2012

## 2012-04-06 DIAGNOSIS — I509 Heart failure, unspecified: Secondary | ICD-10-CM

## 2012-04-08 ENCOUNTER — Telehealth: Payer: Self-pay | Admitting: *Deleted

## 2012-04-08 NOTE — Telephone Encounter (Signed)
Notes Recorded by Lesle Chris, LPN on 07/08/8467 at 10:57 AM Patient notified. OV scheduled for 2/17 with Dr. Shirlee Latch.

## 2012-04-08 NOTE — Telephone Encounter (Signed)
Message copied by Lesle Chris on Thu Apr 08, 2012 10:57 AM ------      Message from: Laurey Morale      Created: Wed Apr 07, 2012  4:39 PM       Inferolateral ischemia/infarction on Myoview, abnormal study, intermediate risk.  Given recent CHF exacerbation and exertional dyspnea, he ought to have a cardiac cath.  Can set this up for me in the [facility] lab any day I am in the hospital at [facility] or I would be glad to have him back in the office to talk about the stress test.

## 2012-04-09 ENCOUNTER — Encounter: Payer: Self-pay | Admitting: *Deleted

## 2012-04-19 ENCOUNTER — Encounter: Payer: Self-pay | Admitting: *Deleted

## 2012-04-19 ENCOUNTER — Encounter: Payer: Self-pay | Admitting: Cardiology

## 2012-04-19 ENCOUNTER — Ambulatory Visit (INDEPENDENT_AMBULATORY_CARE_PROVIDER_SITE_OTHER): Payer: Medicare Other | Admitting: Cardiology

## 2012-04-19 VITALS — BP 123/68 | HR 60 | Ht 69.0 in | Wt 211.8 lb

## 2012-04-19 DIAGNOSIS — I359 Nonrheumatic aortic valve disorder, unspecified: Secondary | ICD-10-CM

## 2012-04-19 DIAGNOSIS — I350 Nonrheumatic aortic (valve) stenosis: Secondary | ICD-10-CM

## 2012-04-19 DIAGNOSIS — R0609 Other forms of dyspnea: Secondary | ICD-10-CM

## 2012-04-19 DIAGNOSIS — I10 Essential (primary) hypertension: Secondary | ICD-10-CM

## 2012-04-19 DIAGNOSIS — I509 Heart failure, unspecified: Secondary | ICD-10-CM

## 2012-04-19 DIAGNOSIS — R9439 Abnormal result of other cardiovascular function study: Secondary | ICD-10-CM

## 2012-04-19 DIAGNOSIS — I2510 Atherosclerotic heart disease of native coronary artery without angina pectoris: Secondary | ICD-10-CM | POA: Insufficient documentation

## 2012-04-19 DIAGNOSIS — R0600 Dyspnea, unspecified: Secondary | ICD-10-CM

## 2012-04-19 DIAGNOSIS — Z01810 Encounter for preprocedural cardiovascular examination: Secondary | ICD-10-CM

## 2012-04-19 DIAGNOSIS — I5032 Chronic diastolic (congestive) heart failure: Secondary | ICD-10-CM

## 2012-04-19 LAB — PROTIME-INR

## 2012-04-19 NOTE — Patient Instructions (Signed)
   Left JV heart catherization  Pre-cath labs - do today Continue all current medications.  Follow up will be given after procedure

## 2012-04-19 NOTE — Progress Notes (Signed)
Patient ID: Shaun Whitney, male   DOB: Mar 22, 1930, 79 y.o.   MRN: [id number] PCP: Dr. Sherryll Burger  79 yo with history of DM, HTN, and hyperlipidemia presented initially for evaluation of aortic stenosis.  Patient had an echo at PCP's office in 9/13 with question of severe AS and possibly decreased LV systolic function.  I do not have the images available to review.  AS is described as visually severe but calculated valve area was 1.2 cm^2 and mean gradient was 17 mmHg only.  EF was described as 35-45%.  Patient is not sure why he had the echo done but it may have been because of aortic-area murmur.  I did a TEE in 11/13.  This showed normal EF (55%) with mild AS by valve area and mean gradient and perhaps moderate AS visually.    He returned to the office to see [name] on 03/01/12 due to the development of exertional dyspnea around Christmas.  He was felt to be volume overloaded on exam.  Lasix was started with weight loss and improvement in breathing.  He is breathing much better.  He denies exertional dyspnea now except with heavy exertion.  He is ok with walking around the house.  He says that he can walk up a flight of steps without problems.  Weight is down another 1 lb compared to prior visit.  No PND or orthopnea.  He has not had any chest pain.    Given the new-onset heart failure and exertional dyspnea, I had him do a [employer]  This was an intermediate risk study with a partially reversible inferior and inferolateral perfusion defect.  EF was 48%.    Labs (2/13): K 3.6, creatinine 1.26, LDL 46, HDL 34, HCT 44.5 Labs (12/13): Creatinine 1.2, BNP 310, LDL 95, HDL 33 Labs (1/14): K 3.4=>4.1, creatinine 1.87=>1.2, BNP 97  PMH: 1. Aortic stenosis: Echo in 9/13 at outside facility showed EF 35-45%, AS described as severe visually but AVA 1.2 cm^2 and mean gradient only 17 mmHg.  TEE (11/13) with EF 55%, mild-moderate LVH, mild AS by gradient and valve area (mean 18 mmHg, AVA 1.74 cm^2) but  moderate visually.  2. Obesity.  3. Gout 4. HTN 5. Type II diabetes 6. GERD 7. Diastolic CHF: EF 40% by TEE in 11/13.  8. CAD: Lexiscan myoview (2/14) was intermediate risk with a partially reversible basal to mid inferior and inferolateral perfusion defect and EF 48%.   FH: no cardiac problems that he knows of.   SH: Lives in [place].  Nonsmoker, married, retired from [employer].  ROS: All systems reviewed and negative except as per HPI.   Current Outpatient Prescriptions  Medication Sig Dispense Refill  . allopurinol (ZYLOPRIM) 300 MG tablet Take 300 mg by mouth daily.      Marland Kitchen ALPRAZolam (XANAX) 0.5 MG tablet Take 0.5 mg by mouth 2 (two) times daily as needed.      Marland Kitchen amLODipine (NORVASC) 10 MG tablet Take 10 mg by mouth daily.      Marland Kitchen aspirin EC 81 MG tablet Take 81 mg by mouth daily.      . carvedilol (COREG) 12.5 MG tablet Take 1.5 tablets (18.75 mg total) by mouth 2 (two) times daily.  90 tablet  6  . furosemide (LASIX) 40 MG tablet Take 1 tablet (40 mg total) by mouth daily.  30 tablet  6  . gabapentin (NEURONTIN) 100 MG capsule Take 100 mg by mouth 3 (three) times daily.      Marland Kitchen  glimepiride (AMARYL) 2 MG tablet Take 2 mg by mouth 2 (two) times daily.      . hydrochlorothiazide (HYDRODIURIL) 25 MG tablet Take 25 mg by mouth daily.      Marland Kitchen lisinopril (PRINIVIL,ZESTRIL) 40 MG tablet Take 1 tablet (40 mg total) by mouth daily.  90 tablet  3  . metFORMIN (GLUCOPHAGE) 500 MG tablet Take 1,000 mg by mouth 2 (two) times daily with a meal.       . Misc Natural Products (OSTEO BI-FLEX ADV DOUBLE ST PO) Take 1 tablet by mouth 2 (two) times daily.      Marland Kitchen omeprazole (PRILOSEC) 20 MG capsule Take 1 capsule by mouth Daily.      . pravastatin (PRAVACHOL) 40 MG tablet Take 40 mg by mouth daily.       No current facility-administered medications for this visit.    BP 123/68  Pulse 60  Ht 5\' 9"  (1.753 m)  Wt 211 lb 12.8 oz (96.072 kg)  BMI 31.26 kg/m2  SpO2 96% General: NAD Neck: No JVD, no  thyromegaly or thyroid nodule.  Lungs: Clear to auscultation bilaterally with normal respiratory effort. CV: Nondisplaced PMI.  Heart regular S1/S2, no S3/S4, 3/6 crescendo-decrescendo systolic murmur RUSB, can hear S2 without muffling.  No edema.  No carotid bruit.  Normal pedal pulses.  Abdomen: Soft, nontender, no hepatosplenomegaly, no distention.  Neurologic: Alert and oriented x 3.  Psych: Normal affect. Extremities: No clubbing or cyanosis.   Assessment/Plan:  1. Aortic stenosis: Mild to moderate AS confirmed by TEE.  Continue to monitor, will repeat echo in 11/14.   2. Diastolic CHF: EF 16-10% by report from outside echo in 9/13.  However, by TEE EF appeared normal (55% in 11/13).  He was seen on 12/30 with diastolic CHF and volume overload.  Lasix was started.  He is doing much better symptomatically, NYHA class II.   - Continue current Lasix.  Creatinine had improved on last check.    3. HTN: BP improved, continue current Coreg and amlodipine dosing.  4. CAD: Lexiscan myoview done due to new-onset CHF and dyspnea.  This study was intermediate risk with a partially reversible inferior and inferolateral perfusion defect.  I think that cardiac catheterization would be advisable at this time.  We discussed all risks and benefits in-depth today. Patient agrees to procedure.  I will arrange at [facility], we can use radial access.  He should continue ASA.  If significant CAD is found, we should put him on a more potent statin.  Will get BMET and CBC today prior to cath.  Would avoid LV-gram given history of decreased GFR.   Marca Ancona 04/19/2012 2:10 PM

## 2012-06-03 NOTE — Progress Notes (Signed)
OK, will have to talk to him in the office.  Would probably have him see me if I am in the office prior to the scheduled appt with Arida since I know him pretty well.

## 2012-06-25 ENCOUNTER — Ambulatory Visit (INDEPENDENT_AMBULATORY_CARE_PROVIDER_SITE_OTHER): Payer: Medicare Other | Admitting: Cardiovascular Disease

## 2012-06-25 ENCOUNTER — Ambulatory Visit: Payer: Medicare Other | Admitting: Cardiology

## 2012-06-25 ENCOUNTER — Encounter: Payer: Self-pay | Admitting: Cardiovascular Disease

## 2012-06-25 VITALS — BP 131/72 | HR 68 | Ht 69.0 in | Wt 210.0 lb

## 2012-06-25 DIAGNOSIS — I429 Cardiomyopathy, unspecified: Secondary | ICD-10-CM

## 2012-06-25 DIAGNOSIS — I428 Other cardiomyopathies: Secondary | ICD-10-CM

## 2012-06-25 DIAGNOSIS — I359 Nonrheumatic aortic valve disorder, unspecified: Secondary | ICD-10-CM

## 2012-06-25 DIAGNOSIS — I350 Nonrheumatic aortic (valve) stenosis: Secondary | ICD-10-CM

## 2012-06-25 NOTE — Patient Instructions (Addendum)
Continue same medications.  Obtain records from Dr. Margaretmary Eddy office regarding possible recent stroke.  Follow up 6 weeks with Gene.

## 2012-06-27 NOTE — Progress Notes (Signed)
Patient ID: Shaun Whitney, male   DOB: 1931/02/10, 79 y.o.   MRN: [id number] PCP: Dr. Sherryll Burger  79 yo with history of DM, HTN, and hyperlipidemia presented initially for evaluation of aortic stenosis.  He was seen and evaluated by Dr. Shirlee Latch. Patient had an echo at PCP's office in 9/13 with question of severe AS and possibly decreased LV systolic function.  AS was described as visually severe but calculated valve area was 1.2 cm^2 and mean gradient was 17 mmHg only.  EF was described as 35-45%.  A TEE was done by Dr. Shirlee Latch in 11/13.  This showed normal EF (55%) with mild AS by valve area and mean gradient and perhaps moderate AS visually.    He returned to the office to see [name] on 03/01/12 due to the development of exertional dyspnea around Christmas.  He was felt to be volume overloaded on exam.  Lasix was started with weight loss and improvement in breathing.  He improved after that. He underwent a Lexiscan myoview.  This was an intermediate risk study with a partially reversible inferior and inferolateral perfusion defect.  EF was 48%.   Cardiac catheterization was recommended. However, we could not schedule the procedure with the patient after multiple attempts. He is now accompanied by his son. He reports that his father possibly had a stroke early this month. He underwent workup with an MRI and possibly carotid Doppler. He is still feeling weak. He denies any chest pain. These records are not available for review.  PMH: 1. Aortic stenosis: Echo in 9/13 at outside facility showed EF 35-45%, AS described as severe visually but AVA 1.2 cm^2 and mean gradient only 17 mmHg.  TEE (11/13) with EF 55%, mild-moderate LVH, mild AS by gradient and valve area (mean 18 mmHg, AVA 1.74 cm^2) but moderate visually.  2. Obesity.  3. Gout 4. HTN 5. Type II diabetes 6. GERD 7. Diastolic CHF: EF 86% by TEE in 11/13.  8. CAD: Lexiscan myoview (2/14) was intermediate risk with a partially reversible basal to mid  inferior and inferolateral perfusion defect and EF 48%.   FH: no cardiac problems that he knows of.   SH: Lives in [place].  Nonsmoker, married, retired from [employer].  ROS: All systems reviewed and negative except as per HPI.   Current Outpatient Prescriptions  Medication Sig Dispense Refill  . allopurinol (ZYLOPRIM) 300 MG tablet Take 300 mg by mouth daily.      Marland Kitchen ALPRAZolam (XANAX) 0.5 MG tablet Take 0.5 mg by mouth 2 (two) times daily as needed.      Marland Kitchen amLODipine (NORVASC) 10 MG tablet Take 10 mg by mouth daily.      Marland Kitchen aspirin 325 MG tablet Take 325 mg by mouth daily.      . carvedilol (COREG) 12.5 MG tablet Take 12.5 mg by mouth 2 (two) times daily.      . furosemide (LASIX) 40 MG tablet Take 1 tablet (40 mg total) by mouth daily.  30 tablet  6  . glimepiride (AMARYL) 2 MG tablet Take 2 mg by mouth 2 (two) times daily.      . hydrochlorothiazide (HYDRODIURIL) 25 MG tablet Take 25 mg by mouth daily.      Marland Kitchen lisinopril (PRINIVIL,ZESTRIL) 40 MG tablet Take 1 tablet (40 mg total) by mouth daily.  90 tablet  3  . metFORMIN (GLUCOPHAGE) 500 MG tablet Take 500 mg by mouth 2 (two) times daily with a meal.       .  omeprazole (PRILOSEC) 20 MG capsule Take 1 capsule by mouth 2 (two) times daily.        No current facility-administered medications for this visit.    BP 131/72  Pulse 68  Ht 5\' 9"  (1.753 m)  Wt 210 lb (95.255 kg)  BMI 31 kg/m2 General: NAD Neck: No JVD, no thyromegaly or thyroid nodule.  Lungs: Clear to auscultation bilaterally with normal respiratory effort. CV: Nondisplaced PMI.  Heart regular S1/S2, no S3/S4, 3/6 crescendo-decrescendo systolic murmur RUSB, can hear S2 without muffling.  No edema.  No carotid bruit.  Normal pedal pulses.  Abdomen: Soft, nontender, no hepatosplenomegaly, no distention.  Neurologic: Alert and oriented x 3.  Psych: Normal affect. Extremities: No clubbing or cyanosis.   Assessment/Plan:  1. Aortic stenosis: Mild to moderate AS confirmed  by TEE.  Continue to monitor, will repeat echo in 11/14.   2. Diastolic CHF: EF 95-62% by report from outside echo in 9/13.  However, by TEE EF appeared normal (55% in 11/13).  He was seen on 12/30 with diastolic CHF and volume overload.  Lasix was started.  He is doing much better symptomatically, NYHA class II.   - Continue current Lasix.   3. HTN: BP improved, continue current Coreg and amlodipine dosing.  4. CAD: Lexiscan myoview done due to new-onset CHF and dyspnea.  This study was intermediate risk with a partially reversible inferior and inferolateral perfusion defect.  Cardiac catheterization was recommended by Dr. Shirlee Latch. However, the patient and his family could not schedule the procedure. There is a report of recent stroke although I don't see signs of unilateral weakness. The patient seems to be having generalized weakness. I will request that a neurologic workup from Dr. Margaretmary Eddy office. . I bring the patient back in 6 weeks for reevaluation and scheduling cardiac catheterization at that time.

## 2012-08-09 ENCOUNTER — Ambulatory Visit: Payer: Medicare Other | Admitting: Physician Assistant

## 2012-09-09 DIAGNOSIS — M549 Dorsalgia, unspecified: Secondary | ICD-10-CM

## 2012-09-26 DIAGNOSIS — M6281 Muscle weakness (generalized): Secondary | ICD-10-CM

## 2012-09-28 ENCOUNTER — Inpatient Hospital Stay (HOSPITAL_COMMUNITY)
Admission: AD | Admit: 2012-09-28 | Discharge: 2012-10-15 | DRG: 097 | Disposition: A | Discharge status: Skilled Nursing Facility | Payer: Medicare Other | Source: Other Acute Inpatient Hospital | Attending: Internal Medicine | Admitting: Internal Medicine

## 2012-09-28 ENCOUNTER — Inpatient Hospital Stay (HOSPITAL_COMMUNITY): Payer: Medicare Other

## 2012-09-28 DIAGNOSIS — N183 Chronic kidney disease, stage 3 unspecified: Secondary | ICD-10-CM

## 2012-09-28 DIAGNOSIS — E119 Type 2 diabetes mellitus without complications: Secondary | ICD-10-CM

## 2012-09-28 DIAGNOSIS — I82402 Acute embolism and thrombosis of unspecified deep veins of left lower extremity: Secondary | ICD-10-CM

## 2012-09-28 DIAGNOSIS — Z79899 Other long term (current) drug therapy: Secondary | ICD-10-CM

## 2012-09-28 DIAGNOSIS — G92 Toxic encephalopathy: Secondary | ICD-10-CM | POA: Diagnosis present

## 2012-09-28 DIAGNOSIS — G912 (Idiopathic) normal pressure hydrocephalus: Secondary | ICD-10-CM

## 2012-09-28 DIAGNOSIS — L8992 Pressure ulcer of unspecified site, stage 2: Secondary | ICD-10-CM | POA: Diagnosis present

## 2012-09-28 DIAGNOSIS — R0600 Dyspnea, unspecified: Secondary | ICD-10-CM

## 2012-09-28 DIAGNOSIS — I359 Nonrheumatic aortic valve disorder, unspecified: Secondary | ICD-10-CM | POA: Diagnosis present

## 2012-09-28 DIAGNOSIS — I2510 Atherosclerotic heart disease of native coronary artery without angina pectoris: Secondary | ICD-10-CM | POA: Diagnosis present

## 2012-09-28 DIAGNOSIS — N390 Urinary tract infection, site not specified: Secondary | ICD-10-CM

## 2012-09-28 DIAGNOSIS — R509 Fever, unspecified: Secondary | ICD-10-CM

## 2012-09-28 DIAGNOSIS — D649 Anemia, unspecified: Secondary | ICD-10-CM | POA: Diagnosis present

## 2012-09-28 DIAGNOSIS — K219 Gastro-esophageal reflux disease without esophagitis: Secondary | ICD-10-CM | POA: Diagnosis present

## 2012-09-28 DIAGNOSIS — N179 Acute kidney failure, unspecified: Secondary | ICD-10-CM

## 2012-09-28 DIAGNOSIS — D72829 Elevated white blood cell count, unspecified: Secondary | ICD-10-CM | POA: Diagnosis present

## 2012-09-28 DIAGNOSIS — Z87891 Personal history of nicotine dependence: Secondary | ICD-10-CM

## 2012-09-28 DIAGNOSIS — N170 Acute kidney failure with tubular necrosis: Secondary | ICD-10-CM | POA: Diagnosis present

## 2012-09-28 DIAGNOSIS — I10 Essential (primary) hypertension: Secondary | ICD-10-CM

## 2012-09-28 DIAGNOSIS — G9340 Encephalopathy, unspecified: Secondary | ICD-10-CM | POA: Diagnosis present

## 2012-09-28 DIAGNOSIS — G929 Unspecified toxic encephalopathy: Secondary | ICD-10-CM | POA: Diagnosis present

## 2012-09-28 DIAGNOSIS — G0490 Encephalitis and encephalomyelitis, unspecified: Principal | ICD-10-CM | POA: Diagnosis present

## 2012-09-28 DIAGNOSIS — E876 Hypokalemia: Secondary | ICD-10-CM

## 2012-09-28 DIAGNOSIS — Z7982 Long term (current) use of aspirin: Secondary | ICD-10-CM

## 2012-09-28 DIAGNOSIS — M21339 Wrist drop, unspecified wrist: Secondary | ICD-10-CM | POA: Diagnosis not present

## 2012-09-28 DIAGNOSIS — L89109 Pressure ulcer of unspecified part of back, unspecified stage: Secondary | ICD-10-CM | POA: Diagnosis present

## 2012-09-28 DIAGNOSIS — R1312 Dysphagia, oropharyngeal phase: Secondary | ICD-10-CM | POA: Diagnosis present

## 2012-09-28 DIAGNOSIS — E860 Dehydration: Secondary | ICD-10-CM

## 2012-09-28 DIAGNOSIS — I5042 Chronic combined systolic (congestive) and diastolic (congestive) heart failure: Secondary | ICD-10-CM

## 2012-09-28 DIAGNOSIS — M25532 Pain in left wrist: Secondary | ICD-10-CM

## 2012-09-28 DIAGNOSIS — I82409 Acute embolism and thrombosis of unspecified deep veins of unspecified lower extremity: Secondary | ICD-10-CM | POA: Diagnosis present

## 2012-09-28 DIAGNOSIS — I429 Cardiomyopathy, unspecified: Secondary | ICD-10-CM

## 2012-09-28 DIAGNOSIS — E46 Unspecified protein-calorie malnutrition: Secondary | ICD-10-CM | POA: Diagnosis present

## 2012-09-28 DIAGNOSIS — M109 Gout, unspecified: Secondary | ICD-10-CM | POA: Diagnosis present

## 2012-09-28 DIAGNOSIS — I6789 Other cerebrovascular disease: Secondary | ICD-10-CM | POA: Diagnosis present

## 2012-09-28 DIAGNOSIS — E870 Hyperosmolality and hypernatremia: Secondary | ICD-10-CM

## 2012-09-28 DIAGNOSIS — I509 Heart failure, unspecified: Secondary | ICD-10-CM | POA: Diagnosis present

## 2012-09-28 DIAGNOSIS — D539 Nutritional anemia, unspecified: Secondary | ICD-10-CM | POA: Diagnosis present

## 2012-09-28 DIAGNOSIS — R1310 Dysphagia, unspecified: Secondary | ICD-10-CM

## 2012-09-28 DIAGNOSIS — D696 Thrombocytopenia, unspecified: Secondary | ICD-10-CM

## 2012-09-28 DIAGNOSIS — Z96659 Presence of unspecified artificial knee joint: Secondary | ICD-10-CM

## 2012-09-28 LAB — COMPREHENSIVE METABOLIC PANEL
ALT: 28 U/L (ref 0–53)
BUN: 34 mg/dL — ABNORMAL HIGH (ref 6–23)
CO2: 20 mEq/L (ref 19–32)
Calcium: 9.3 mg/dL (ref 8.4–10.5)
Creatinine, Ser: 1.36 mg/dL — ABNORMAL HIGH (ref 0.50–1.35)
GFR calc Af Amer: 55 mL/min — ABNORMAL LOW (ref >90)
GFR calc non Af Amer: 48 mL/min — ABNORMAL LOW (ref >90)
Glucose, Bld: 158 mg/dL — ABNORMAL HIGH (ref 70–99)
Sodium: 163 mEq/L (ref 135–145)
Total Protein: 6.8 g/dL (ref 6.0–8.3)

## 2012-09-28 LAB — RETICULOCYTES
RBC.: 3.64 MIL/uL — ABNORMAL LOW (ref 4.22–5.81)
Retic Ct Pct: 1.3 % (ref 0.4–3.1)

## 2012-09-28 LAB — PHOSPHORUS: Phosphorus: 2.7 mg/dL (ref 2.3–4.6)

## 2012-09-28 LAB — CBC WITH DIFFERENTIAL/PLATELET
Basophils Absolute: 0 10*3/uL (ref 0.0–0.1)
Eosinophils Relative: 1 % (ref 0–5)
Lymphocytes Relative: 14 % (ref 12–46)
Lymphs Abs: 2.4 10*3/uL (ref 0.7–4.0)
MCV: 104.8 fL — ABNORMAL HIGH (ref 78.0–100.0)
Neutrophils Relative %: 81 % — ABNORMAL HIGH (ref 43–77)
Platelets: 146 10*3/uL — ABNORMAL LOW (ref 150–400)
RBC: 3.56 MIL/uL — ABNORMAL LOW (ref 4.22–5.81)
RDW: 14.1 % (ref 11.5–15.5)
WBC: 17.1 10*3/uL — ABNORMAL HIGH (ref 4.0–10.5)

## 2012-09-28 LAB — URINALYSIS, ROUTINE W REFLEX MICROSCOPIC
Glucose, UA: NEGATIVE mg/dL
Ketones, ur: NEGATIVE mg/dL
Nitrite: NEGATIVE
Specific Gravity, Urine: 1.019 (ref 1.005–1.030)
pH: 5 (ref 5.0–8.0)

## 2012-09-28 LAB — CREATININE, URINE, RANDOM: Creatinine, Urine: 179.89 mg/dL

## 2012-09-28 LAB — SODIUM, URINE, RANDOM: Sodium, Ur: 30 mEq/L

## 2012-09-28 LAB — MAGNESIUM: Magnesium: 2.1 mg/dL (ref 1.5–2.5)

## 2012-09-28 LAB — URINE MICROSCOPIC-ADD ON

## 2012-09-28 LAB — GLUCOSE, CAPILLARY: Glucose-Capillary: 157 mg/dL — ABNORMAL HIGH (ref 70–99)

## 2012-09-28 LAB — LACTIC ACID, PLASMA: Lactic Acid, Venous: 1.6 mmol/L (ref 0.5–2.2)

## 2012-09-28 LAB — PROTIME-INR
INR: 1.26 (ref 0.00–1.49)
Prothrombin Time: 15.5 seconds — ABNORMAL HIGH (ref 11.6–15.2)

## 2012-09-28 LAB — AMMONIA: Ammonia: 24 umol/L (ref 11–60)

## 2012-09-28 MED ORDER — POTASSIUM CHLORIDE 10 MEQ/100ML IV SOLN
10.0000 meq | INTRAVENOUS | Status: AC
  Administered 2012-09-28: 10 meq via INTRAVENOUS
  Filled 2012-09-28: qty 100

## 2012-09-28 MED ORDER — DEXTROSE 5 % IV SOLN
INTRAVENOUS | Status: DC
  Administered 2012-09-28 – 2012-09-29 (×3): via INTRAVENOUS
  Administered 2012-09-29: 1000 mL via INTRAVENOUS
  Administered 2012-09-30 – 2012-10-02 (×5): via INTRAVENOUS

## 2012-09-28 MED ORDER — ACETAMINOPHEN 650 MG RE SUPP
650.0000 mg | Freq: Four times a day (QID) | RECTAL | Status: DC | PRN
  Administered 2012-09-28 – 2012-09-29 (×2): 650 mg via RECTAL
  Filled 2012-09-28 (×3): qty 1

## 2012-09-28 MED ORDER — CARVEDILOL 12.5 MG PO TABS: 12.5000 mg | ORAL_TABLET | Freq: Two times a day (BID) | ORAL | Status: DC

## 2012-09-28 MED ORDER — PIPERACILLIN-TAZOBACTAM 3.375 G IVPB
3.3750 g | Freq: Three times a day (TID) | INTRAVENOUS | Status: DC
  Administered 2012-09-28 – 2012-10-01 (×9): 3.375 g via INTRAVENOUS
  Filled 2012-09-28 (×12): qty 50

## 2012-09-28 MED ORDER — ACETAMINOPHEN 325 MG RE SUPP
325.0000 mg | RECTAL | Status: AC
  Administered 2012-09-28: 325 mg via RECTAL
  Filled 2012-09-28: qty 1

## 2012-09-28 MED ORDER — SODIUM CHLORIDE 0.45 % IV BOLUS
250.0000 mL | Freq: Once | INTRAVENOUS | Status: AC
  Administered 2012-09-28: 250 mL via INTRAVENOUS

## 2012-09-28 MED ORDER — SODIUM CHLORIDE 0.9 % IV BOLUS (SEPSIS): 250.0000 mL | Freq: Once | INTRAVENOUS | Status: DC

## 2012-09-28 MED ORDER — INSULIN ASPART 100 UNIT/ML ~~LOC~~ SOLN
0.0000 [IU] | SUBCUTANEOUS | Status: DC
  Administered 2012-09-28 – 2012-09-30 (×8): 2 [IU] via SUBCUTANEOUS
  Administered 2012-09-30 – 2012-10-01 (×5): 3 [IU] via SUBCUTANEOUS
  Administered 2012-10-01: 2 [IU] via SUBCUTANEOUS
  Administered 2012-10-01 (×2): 3 [IU] via SUBCUTANEOUS

## 2012-09-28 MED ORDER — SODIUM CHLORIDE 0.9 % IJ SOLN
3.0000 mL | Freq: Two times a day (BID) | INTRAMUSCULAR | Status: DC
  Administered 2012-09-29 – 2012-10-15 (×16): 3 mL via INTRAVENOUS

## 2012-09-28 MED ORDER — INSULIN ASPART 100 UNIT/ML ~~LOC~~ SOLN: 0.0000 [IU] | Freq: Three times a day (TID) | SUBCUTANEOUS | Status: DC

## 2012-09-28 MED ORDER — ENOXAPARIN SODIUM 40 MG/0.4ML ~~LOC~~ SOLN
40.0000 mg | SUBCUTANEOUS | Status: DC
  Administered 2012-09-28 – 2012-09-29 (×2): 40 mg via SUBCUTANEOUS
  Filled 2012-09-28 (×3): qty 0.4

## 2012-09-28 NOTE — H&P (Signed)
Shaun Whitney History and Physical  CACE OSORTO ZOX:[id number] DOB: [birth year] DOA: 09/28/2012  Referring physician: Dr Sherryll Burger PCP: Kirstie Peri, MD  Specialists: none  Chief Complaint: transferred from [facility] per family request.   HPI: Shaun Whitney is a 79 y.o. male with h/o recent hospitalization [facility] for confusion and generalized weakness was discharged to SNF with tegretol for possible focal seizures after 2 weeks stay at SNF, he became more somnolent, decreased po intake,. He was [facility], underwent MRI of the brain , did not show any acute stroke, showed old strokes. He was found to be in acute renal failure and hyeprnatremic. He was treated for ARF and hypernatremia with fluids. But he remained confused and encephalopathic. Today family requested PCP to transfer pt to [facility] for possible neurology consult. He was accepted to medical service to stepdown . All the history is obtained from the son and daughter in law at bedside. Pt is encephalopathic and no history obtained from him.  Review of Systems: could not be obtained.   Past Medical History  Diagnosis Date  . Aortic stenosis   . Diabetes mellitus type II   . Hypertension   . Gout   . GERD (gastroesophageal reflux disease)    Past Surgical History  Procedure Laterality Date  . Joint replacement      right knee  . Tee without cardioversion  01/15/2012    Procedure: TRANSESOPHAGEAL ECHOCARDIOGRAM (TEE);  Surgeon: Laurey Morale, MD;  Location: [facility] ENDOSCOPY;  Service: Cardiovascular;  Laterality: N/A;   Social History:  reports that he quit smoking about 31 years ago. His smoking use included Cigarettes. He started smoking about 61 years ago. He has a 30 pack-year smoking history. He has never used smokeless tobacco. He reports that he does not drink alcohol or use illicit drugs.  where does patient live--, SNF  No Known Allergies  No family history on file.    Prior to Admission medications   Medication Sig Start Date End Date Taking? Authorizing Provider  allopurinol (ZYLOPRIM) 300 MG tablet Take 300 mg by mouth daily.    Historical Provider, MD  ALPRAZolam Prudy Feeler) 0.5 MG tablet Take 0.5 mg by mouth 2 (two) times daily as needed.    Historical Provider, MD  amLODipine (NORVASC) 10 MG tablet Take 10 mg by mouth daily.    Historical Provider, MD  aspirin 325 MG tablet Take 325 mg by mouth daily.    Historical Provider, MD  carvedilol (COREG) 12.5 MG tablet Take 12.5 mg by mouth 2 (two) times daily. 04/02/12   Laurey Morale, MD  furosemide (LASIX) 40 MG tablet Take 1 tablet (40 mg total) by mouth daily. 03/01/12   Prescott Parma, PA-C  glimepiride (AMARYL) 2 MG tablet Take 2 mg by mouth 2 (two) times daily.    Historical Provider, MD  hydrochlorothiazide (HYDRODIURIL) 25 MG tablet Take 25 mg by mouth daily.    Historical Provider, MD  lisinopril (PRINIVIL,ZESTRIL) 40 MG tablet Take 1 tablet (40 mg total) by mouth daily. 02/12/12   Laurey Morale, MD  metFORMIN (GLUCOPHAGE) 500 MG tablet Take 500 mg by mouth 2 (two) times daily with a meal.     Historical Provider, MD  omeprazole (PRILOSEC) 20 MG capsule Take 1 capsule by mouth 2 (two) times daily.  11/19/11   Historical Provider, MD   Physical Exam: Filed Vitals:   09/28/12 1434  BP: 128/65  Pulse: 82  Temp: 99.3 F (37.4 C)  TempSrc: Oral  Resp: 22  Height: 5\' 9"  (1.753 m)  Weight: 83.8 kg (184 lb 11.9 oz)  SpO2: 96%    Constitutional: Vital signs reviewed.  Patient is a well-developed and well-nourished  in no acute distress and cooperative with exam. Alert and oriented xo.  Head: Normocephalic and atraumatic Mouth: no erythema or exudates, MMM Eyes: PERRL, EOMI, conjunctivae normal, No scleral icterus.  Neck: Supple, Trachea midline normal ROM, No JVD, mass, thyromegaly, or carotid bruit present.  Cardiovascular: RRR, S1 normal, S2 normal, , pulses symmetric and intact  bilaterally Pulmonary/Chest: decreased air entry at bases. Abdominal: Soft. Non-tender, non-distended, bowel sounds are normal, no masses, organomegaly, or guarding present.  Musculoskeletal: left lower extremity swelling. No redness.  Neurological: A&O x0, responding to touch stimuli.  Skin: stage 2 sacral decubitus.     Labs on Admission:  Basic Metabolic Panel: No results found for this basename: NA, K, CL, CO2, GLUCOSE, BUN, CREATININE, CALCIUM, MG, PHOS,  in the last 168 hours Liver Function Tests: No results found for this basename: AST, ALT, ALKPHOS, BILITOT, PROT, ALBUMIN,  in the last 168 hours No results found for this basename: LIPASE, AMYLASE,  in the last 168 hours No results found for this basename: AMMONIA,  in the last 168 hours CBC: No results found for this basename: WBC, NEUTROABS, HGB, HCT, MCV, PLT,  in the last 168 hours Cardiac Enzymes: No results found for this basename: CKTOTAL, CKMB, CKMBINDEX, TROPONINI,  in the last 168 hours  BNP (last 3 results) No results found for this basename: PROBNP,  in the last 8760 hours CBG:  Recent Labs Lab 09/28/12 1716  GLUCAP 157*    Radiological Exams on Admission: No results found.  EKG: pending.   Assessment/Plan Active Problems: 1. Acute encephalopathy:  - possibly secondary to metabolic encephalopathy from renal failure and hypernatremia and UTI.   - MRI brain yesterday showed old infarct.  - Start gentle hydration with D5W.  - bmp q12hrs .  - free water deficit of 6.8 liters.  - he was started on zosyn for UTI. Urine cultures pending.  - please call neuro consult if his mental status doesn't improve in 24 HOURS . - EEG ordered.   2. Sacral decubitus ulcer: wound care consulted for dressing changes.   3. Low grade temp , leukocytosis: possibly form UTI. Started on zosyn.   4. Acute renal failure: possibly secondary to dehydration. Will get renal US. GENTLE hydration. Urine electrolytes.  Possibly from  no po intake   5. Elevated bilirubin/ alk phos/ : GB sludge as per conversation with Dr Sherryll Burger, will get US abdomen. Get ammonia level. Call surgery consult as needed.   6. Anemia: macrocytic. Anemia panel ordered.    7.Diabetes mellitus: hgba1c ordered, SSI.   8. Elevated troponin : max troponin of 0.11 possibly secondary to acute renal failure. Will repeat troponins tonight.   9. ? Dysphagia/ no po intake over the last 2 to 3 decreased: get slp evaluation. Family is discussing about feeding tube placement. Nutrition consult.   10. Hypokalemia: replete as needed.   11. DVT prophylaxis.   POOR Prognosis.   DVT prophylaxis.   Code Status:\full code Family Communication: discussed the plan of action and care with family and POA. Disposition Plan: PENDING pt eval  Time spent: 90 min  Jonay Hitchcock Shaun Whitney Pager [phone]  If 7PM-7AM, please contact night-coverage [email or web address] Password [facility] 09/28/2012, 5:49 PM

## 2012-09-28 NOTE — Progress Notes (Signed)
Family bedside, good historians compared to chart from [facility]. Family describes focal seizures.

## 2012-09-28 NOTE — Progress Notes (Signed)
ANTIBIOTIC CONSULT NOTE - INITIAL  Pharmacy Consult for Zosyn Indication: UTI  No Known Allergies  Patient Measurements: Height: 5\' 9"  (175.3 cm) Weight: 184 lb 11.9 oz (83.8 kg) IBW/kg (Calculated) : 70.7 Adjusted Body Weight:    Vital Signs: Temp: 99.3 F (37.4 C) (07/29 1434) Temp src: Oral (07/29 1434) BP: 128/65 mmHg (07/29 1434) Pulse Rate: 82 (07/29 1434) Intake/Output from previous day:   Intake/Output from this shift:    Labs:  Recent Labs  09/28/12 1744  WBC 17.1*  HGB 11.9*  PLT 146*  CREATININE 1.36*   Estimated Creatinine Clearance: 43.3 ml/min (by C-G formula based on Cr of 1.36). No results found for this basename: VANCOTROUGH, Leodis Binet, VANCORANDOM, GENTTROUGH, GENTPEAK, GENTRANDOM, TOBRATROUGH, TOBRAPEAK, TOBRARND, AMIKACINPEAK, AMIKACINTROU, AMIKACIN,  in the last 72 hours   Microbiology: Recent Results (from the past 720 hour(s))  MRSA PCR SCREENING     Status: None   Collection Time    09/28/12  3:51 PM      Result Value Range Status   MRSA by PCR NEGATIVE  NEGATIVE Final   Comment:            The GeneXpert MRSA Assay (FDA     approved for NASAL specimens     only), is one component of a     comprehensive MRSA colonization     surveillance program. It is not     intended to diagnose MRSA     infection nor to guide or     monitor treatment for     MRSA infections.    Medical History: Past Medical History  Diagnosis Date  . Aortic stenosis   . Diabetes mellitus type II   . Hypertension   . Gout   . GERD (gastroesophageal reflux disease)     Medications:  Prescriptions prior to admission  Medication Sig Dispense Refill  . allopurinol (ZYLOPRIM) 300 MG tablet Take 300 mg by mouth daily.      Marland Kitchen aspirin EC 81 MG tablet Take 81 mg by mouth daily.      . bisacodyl (DULCOLAX) 10 MG suppository Place 10 mg rectally daily as needed for constipation.      . carbamazepine (TEGRETOL) 200 MG tablet Take 200 mg by mouth 2 (two) times daily.       . carvedilol (COREG) 12.5 MG tablet Take 12.5 mg by mouth 2 (two) times daily.      . furosemide (LASIX) 40 MG tablet Take 1 tablet (40 mg total) by mouth daily.  30 tablet  6  . lisinopril (PRINIVIL,ZESTRIL) 40 MG tablet Take 1 tablet (40 mg total) by mouth daily.  90 tablet  3  . magnesium hydroxide (MILK OF MAGNESIA) 400 MG/5ML suspension Take 30 mLs by mouth daily as needed for constipation.      . metFORMIN (GLUCOPHAGE) 500 MG tablet Take 500 mg by mouth 2 (two) times daily with a meal.       . omeprazole (PRILOSEC) 20 MG capsule Take 1 capsule by mouth 2 (two) times daily.       . promethazine (PHENERGAN) 25 MG suppository Place 25 mg rectally every 4 (four) hours as needed for nausea.       Assessment: 79 y/o M transferred from [facility] with focal seizures.  Tmax 99.3. WBC 17.1 elevated, Scr 1.36 with estimated CrCl 43. UA: Cloudy, Mod leukocytes, Large Hgb, many bacteria.   Plan:  Start Zosyn 3.375g IV q8 hrs. No adjustment needed down to CrCl 10. Pharmacy  will sign off. Please reconsult for further dosing assistance.  Merilynn Finland, [employer] 09/28/2012,7:59 PM

## 2012-09-28 NOTE — Progress Notes (Signed)
Pt admitted from [facility] via [place].  Pt arousable, but nonverbal.  VSS.  MD aware of pt admission.   Roselie Awkward, RN

## 2012-09-29 ENCOUNTER — Inpatient Hospital Stay (HOSPITAL_COMMUNITY): Payer: Medicare Other

## 2012-09-29 DIAGNOSIS — D649 Anemia, unspecified: Secondary | ICD-10-CM | POA: Diagnosis present

## 2012-09-29 DIAGNOSIS — E870 Hyperosmolality and hypernatremia: Secondary | ICD-10-CM | POA: Diagnosis present

## 2012-09-29 DIAGNOSIS — N183 Chronic kidney disease, stage 3 unspecified: Secondary | ICD-10-CM | POA: Diagnosis present

## 2012-09-29 DIAGNOSIS — N390 Urinary tract infection, site not specified: Secondary | ICD-10-CM | POA: Diagnosis present

## 2012-09-29 DIAGNOSIS — N179 Acute kidney failure, unspecified: Secondary | ICD-10-CM | POA: Diagnosis present

## 2012-09-29 DIAGNOSIS — D696 Thrombocytopenia, unspecified: Secondary | ICD-10-CM

## 2012-09-29 DIAGNOSIS — R509 Fever, unspecified: Secondary | ICD-10-CM | POA: Diagnosis present

## 2012-09-29 LAB — URINE CULTURE
Colony Count: NO GROWTH
Culture: NO GROWTH

## 2012-09-29 LAB — FOLATE: Folate: 11.7 ng/mL

## 2012-09-29 LAB — GLUCOSE, CAPILLARY
Glucose-Capillary: 172 mg/dL — ABNORMAL HIGH (ref 70–99)
Glucose-Capillary: 157 mg/dL — ABNORMAL HIGH (ref 70–99)
Glucose-Capillary: 194 mg/dL — ABNORMAL HIGH (ref 70–99)

## 2012-09-29 LAB — HEMOGLOBIN A1C: Hgb A1c MFr Bld: 6.5 % — ABNORMAL HIGH (ref <5.7)

## 2012-09-29 LAB — VITAMIN B12: Vitamin B-12: 1008 pg/mL — ABNORMAL HIGH (ref 211–911)

## 2012-09-29 LAB — BASIC METABOLIC PANEL
BUN: 41 mg/dL — ABNORMAL HIGH (ref 6–23)
CO2: 20 mEq/L (ref 19–32)
Chloride: 131 mEq/L (ref 96–112)
Creatinine, Ser: 1.78 mg/dL — ABNORMAL HIGH (ref 0.50–1.35)
GFR calc non Af Amer: 32 mL/min — ABNORMAL LOW (ref >90)
Glucose, Bld: 179 mg/dL — ABNORMAL HIGH (ref 70–99)
Potassium: 3.3 mEq/L — ABNORMAL LOW (ref 3.5–5.1)
Sodium: 161 mEq/L (ref 135–145)

## 2012-09-29 LAB — CBC
Hemoglobin: 10.5 g/dL — ABNORMAL LOW (ref 13.0–17.0)
MCH: 34.3 pg — ABNORMAL HIGH (ref 26.0–34.0)
MCV: 103.3 fL — ABNORMAL HIGH (ref 78.0–100.0)
RBC: 3.06 MIL/uL — ABNORMAL LOW (ref 4.22–5.81)

## 2012-09-29 MED ORDER — SODIUM CHLORIDE 0.9 % IV BOLUS (SEPSIS)
1000.0000 mL | Freq: Once | INTRAVENOUS | Status: AC
  Administered 2012-09-29: 1000 mL via INTRAVENOUS

## 2012-09-29 MED ORDER — SODIUM CHLORIDE 0.45 % IV BOLUS
500.0000 mL | Freq: Once | INTRAVENOUS | Status: AC
  Administered 2012-09-29: 500 mL via INTRAVENOUS

## 2012-09-29 MED ORDER — ACETAMINOPHEN 10 MG/ML IV SOLN
500.0000 mg | Freq: Once | INTRAVENOUS | Status: AC
  Administered 2012-09-29: 500 mg via INTRAVENOUS
  Filled 2012-09-29: qty 100

## 2012-09-29 MED ORDER — SODIUM CHLORIDE 0.45 % IV BOLUS: 250.0000 mL | Freq: Once | INTRAVENOUS | Status: DC

## 2012-09-29 NOTE — Progress Notes (Signed)
Speech Language Pathology  Patient Details Name: Shaun Whitney MRN: [id number] DOB: [birth year] Today's Date: 09/29/2012 Time:  -     Attempted swallow assessment at approximately 9:25 and pt. Out for testing.  Will return later today.  Breck Coons [employer].Ed CCC-SLP Pager [phone]  09/29/2012        09/29/2012, 10:49 AM

## 2012-09-29 NOTE — Progress Notes (Signed)
eeg completed ° °

## 2012-09-29 NOTE — Progress Notes (Signed)
INITIAL NUTRITION ASSESSMENT  DOCUMENTATION CODES Per approved criteria  -Severe malnutrition in the context of chronic illness   INTERVENTION:  Diet advancement as able pending results of swallow evaluation with SLP.  If unable to safely advance PO diet, recommend place NGT and start TF (Glucerna 1.2 at 65 ml/h with Prostat 30 ml BID wound provide 2072 kcals, 124 gm protein, 1256 ml free water daily).  NUTRITION DIAGNOSIS: Malnutrition related to inadequate oral intake as evidenced by 12% weight loss in the past 3 months and severe depletion of muscle mass.   Goal: Intake to meet >90% of estimated nutrition needs.  Monitor:  Diet advancement vs need to place NGT for feedings, PO intake, labs, weight trend, wound healing.  Reason for Assessment: MD Consult re: 2-3 weeks with minimal PO intake.  79 y.o. male  Admitting Dx: Acute renal failure; Hypernatremia  ASSESSMENT: Patient was transferred from [facility] for continued encephalopathy. Per review of physician notes, he has had minimal PO intake for the past 2-3 weeks during which time he has been in a SNF. Patient with 12% weight loss in the past 3 months, likely related to inadequate oral intake since stroke and admission to SNF. Patient with increased nutrient needs to support healing of stage 3 pressure ulcer on sacrum.  Per RN, plans for swallow evaluation with SLP to determine if patient can safely take PO's.  Nutrition Focused Physical Exam:  Subcutaneous Fat:  Orbital Region: WNL Upper Arm Region: NA Thoracic and Lumbar Region: NA  Muscle:  Temple Region: WNL Clavicle Bone Region: severe depletion Clavicle and Acromion Bone Region: severe depletion Scapular Bone Region: NA Dorsal Hand: NA Patellar Region: NA Anterior Thigh Region: NA Posterior Calf Region: NA  Edema: none  Height: Ht Readings from Last 1 Encounters:  09/28/12 5\' 9"  (1.753 m)    Weight: Wt Readings from Last 1 Encounters:   09/28/12 184 lb 11.9 oz (83.8 kg)    Ideal Body Weight: 72.7 kg  % Ideal Body Weight: 115%  Wt Readings from Last 10 Encounters:  09/28/12 184 lb 11.9 oz (83.8 kg)  06/25/12 210 lb (95.255 kg)  04/19/12 211 lb 12.8 oz (96.072 kg)  04/02/12 212 lb (96.163 kg)  03/04/12 226 lb (102.513 kg)  03/01/12 230 lb (104.327 kg)  02/12/12 218 lb (98.884 kg)  01/15/12 218 lb (98.884 kg)  01/15/12 218 lb (98.884 kg)  12/15/11 210 lb (95.255 kg)    Usual Body Weight: 210 lb  % Usual Body Weight: 88%  BMI:  Body mass index is 27.27 kg/(m^2).  Estimated Nutritional Needs: Kcal: 1900-2100 Protein: 115-130 gm Fluid: 2-2.2 L  Skin: large stage 3 pressure ulcer on sacrum  Diet Order: NPO  EDUCATION NEEDS: -Education not appropriate at this time   Intake/Output Summary (Last 24 hours) at 09/29/12 0908 Last data filed at 09/29/12 0700  Gross per 24 hour  Intake 2542.08 ml  Output    705 ml  Net 1837.08 ml    Last BM: 7/27   Labs:   Recent Labs Lab 09/28/12 1744 09/29/12 0205  NA 163* 163*  K 3.4* 3.3*  CL 128* 131*  CO2 20 20  BUN 34* 41*  CREATININE 1.36* 1.78*  CALCIUM 9.3 8.9  MG 2.1  --   PHOS 2.7  --   GLUCOSE 158* 175*    CBG (last 3)   Recent Labs  09/29/12 0022 09/29/12 0434 09/29/12 0751  GLUCAP 172* 162* 157*    Scheduled Meds: .  enoxaparin (LOVENOX) injection  40 mg Subcutaneous Q24H  . insulin aspart  0-9 Units Subcutaneous Q4H  . piperacillin-tazobactam (ZOSYN)  IV  3.375 g Intravenous Q8H  . sodium chloride  3 mL Intravenous Q12H    Continuous Infusions: . dextrose 75 mL/hr at 09/29/12 0759    Past Medical History  Diagnosis Date  . Aortic stenosis   . Diabetes mellitus type II   . Hypertension   . Gout   . GERD (gastroesophageal reflux disease)     Past Surgical History  Procedure Laterality Date  . Joint replacement      right knee  . Tee without cardioversion  01/15/2012    Procedure: TRANSESOPHAGEAL ECHOCARDIOGRAM  (TEE);  Surgeon: Laurey Morale, MD;  Location: [facility] ENDOSCOPY;  Service: Cardiovascular;  Laterality: N/A;    Joaquin Courts, RD, LDN, CNSC Pager [phone] After Hours Pager [phone]

## 2012-09-29 NOTE — Progress Notes (Signed)
Throughout course of night shift, Shaun Whitney of [facility] consulted several times regarding pt's temperature and Blood Pressure (as noted in flowsheet). Pt remained febrile despite administration totaling 1.5g of Tylenol (PR and IV) and several fluid boluses. Pt was ultimately placed on cooling blanket and at end of shift rectal temps ranged from 100.0 to 100.3. After 1750 of bolus fluid and maintenance of D5W@ 75, pt's BP normalized in the 110s-120s Systolic (as noted in flowsheet). Day shift RN aware of nights events- she will continue to monitor and assess.

## 2012-09-29 NOTE — Progress Notes (Signed)
Utilization review completed.  

## 2012-09-29 NOTE — Consult Note (Signed)
WOC consult Note Reason for Consult:Pressure Ulcer (Initially noted by MD to be a Stage II) Wound type:Pressure Ulcer with shear resulting in a sDTI (suspected deep tissue injury). Currently in evolution as the top layer of skin sloughs and reveals a moist wound bed, ruddy red in color. Linear opening at apex of gluteal cleft is likely due to moisture (Moisture Associated Skin Damage, (MASD)), specifically Intertriginous (ITD) Dermatitis. Pressure Ulcer POA: Yes Measurement: Ulceration presents on bilateral aspects of the gluteal cleft and extends upward to sacrum. Measurements: 9cm x 6cm x 0.2cm on the right, a linear opening at the apex of the gluteal cleft measures 4cm x 0.4cm x 0.2cm, and there is an open area on the left measuring 4cm x 3cm x 0.2cm. All open areas are partial thickness. Wound ZOX:WRUEA red in appearance, moist with residual epidermis (from sloughing) clinging at the wound periphery. Drainage (amount, consistency, odor) small amount of light yellow (serous) exudate on old dressing. Periwound:intact, clear. Dressing procedure/placement/frequency:Patient is on a therapeutic mattress with a low air loss feature and is turned and repositioned per protocol. He is febrile and a cooling blanket is in use. There is a soft silicone foam dressing covering the ulcer and it has exudate on it so I have replaced. I will continue the same therapeutic dressing to minimize shear force when turning and repositioning, provide some absorbency and provide a moisture retentive healing environment.  Patient is unable to assist with repositioning, becomes stiff and pushes against you when turning maneuvers are performed. DermaTherapy therapeutic linen system in place. He voices no pain, rouses to his name being called and denies pain. WOC Nursing Team will not follow routinely, but will remain available to this patient, his family, the medical and nursing teams.  Please re-consult if needed. Thanks, Ladona Mow, MSN, RN, [facility], CWOCN [phone])

## 2012-09-29 NOTE — Progress Notes (Signed)
[facility] Progress Note [place] TEAM 1 - Stepdown/ICU TEAM   Shaun Whitney WUJ:[id number] DOB: May 28, 1930 DOA: 09/28/2012 PCP: Kirstie Peri, MD  Brief narrative: 79 year old male patient recently hospitalized at [facility] on 09/09/2012 for confusion and generalized weakness. Apparently it was felt the patient may have been having focal seizures and Tegretol was started during that admission. He was subsequently discharged to a skilled nursing facility. 2 weeks later patient developed increasing lethargy and decreased oral intake. He was readmitted to [facility] 09/26/2012. 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 He was found to be in acute renal failure with hypernatremia and was hydrated. He remained confused and encephalopathic. Family spoke with the patient's primary care physician requested the patient be transferred [facility] for possible neurology consultation.  Assessment/Plan: Active Problems:   Acute encephalopathy/SIRS -due to hyperNa+ and infection/fever -prior MRI reported as negative -also has sepsis from UTI which may be contributing -EEG pending    Dehydration with hypernatremia -Na trend down from 163 to 160 after hydration -was on Lasix pre admit and if given in setting of poor intake contributed to pt's profound DH    Acute renal failure on CKD (chronic kidney disease) stage 3, GFR 30-59 ml/min -baseline Scr 1.18 with GFR around 60 -follow lytes after hydration -[place] normal kidneys      Chronic combined systolic and diastolic congestive heart failure -compensated    Hypokalemia -IV replete    Fever/leukocytosis/UTI -continue Zosyn    HTN (hypertension) -improved but still soft    CAD (coronary artery disease) -EKG and TNI OK    Thrombocytopenia, unspecified -new and suspect due to consumption fromSepsis -Cautious use of Lovenox    Anemia -hgb stable   DVT prophylaxis: Lovenox Code Status: Full Family  Communication: No family at bedside upon evaluation Disposition Plan/Expected LOS: Remain in step down Isolation: None Nutritional Status: Suspect acute protein calorie malnutrition related to recent failure to thrive and likely underlying chronic reticular malnutrition related to dementia  Consultants: None  Procedures: EEG pending Left Lower extremity venous Doppler pending  Antibiotics: Zosyn 7/29 >>>  HPI/Subjective: Patient opens eyes to tactile stimulation and loud voice but is nonverbal.   Objective: Blood pressure 114/60, pulse 61, temperature 99.8 F (37.7 C), temperature source Oral, resp. rate 17, height 5\' 9"  (1.753 m), weight 83.8 kg (184 lb 11.9 oz), SpO2 98.00%.  Intake/Output Summary (Last 24 hours) at 09/29/12 1339 Last data filed at 09/29/12 1145  Gross per 24 hour  Intake 2542.08 ml  Output   1155 ml  Net 1387.08 ml     Exam: General: No acute respiratory distress Lungs: Clear to auscultation bilaterally without wheezes or crackles, RA Cardiovascular: Regular rate and rhythm without murmur gallop or rub normal S1 and S2, no peripheral edema or JVD Abdomen: Nontender, nondistended, soft, bowel sounds positive, no rebound, no ascites, no appreciable mass Musculoskeletal: No significant cyanosis, clubbing of bilateral lower extremities Neurological: Awakens but remains lethargic  Scheduled Meds: Scheduled Meds: . enoxaparin (LOVENOX) injection  40 mg Subcutaneous Q24H  . insulin aspart  0-9 Units Subcutaneous Q4H  . piperacillin-tazobactam (ZOSYN)  IV  3.375 g Intravenous Q8H  . sodium chloride  3 mL Intravenous Q12H   Continuous Infusions: . dextrose 1,000 mL (09/29/12 1137)    **Reviewed in detail by the Attending Physician   Data Reviewed: Basic Metabolic Panel:  Recent Labs Lab 09/28/12 1744 09/29/12 0205 09/29/12 1010  NA 163* 163* 161*  K 3.4*  3.3* 3.3*  CL 128* 131* 130*  CO2 20 20 22   GLUCOSE 158* 175* 179*  BUN 34* 41* 41*   CREATININE 1.36* 1.78* 1.88*  CALCIUM 9.3 8.9 8.7  MG 2.1  --   --   PHOS 2.7  --   --    Liver Function Tests:  Recent Labs Lab 09/28/12 1744  AST 29  ALT 28  ALKPHOS 291*  BILITOT 3.6*  PROT 6.8  ALBUMIN 2.3*   No results found for this basename: LIPASE, AMYLASE,  in the last 168 hours  Recent Labs Lab 09/28/12 2121  AMMONIA 24   CBC:  Recent Labs Lab 09/28/12 1744 09/29/12 0205  WBC 17.1* 17.8*  NEUTROABS 13.7*  --   HGB 11.9* 10.5*  HCT 37.3* 31.6*  MCV 104.8* 103.3*  PLT 146* 132*   Cardiac Enzymes:  Recent Labs Lab 09/28/12 2122  TROPONINI <0.30   BNP (last 3 results) No results found for this basename: PROBNP,  in the last 8760 hours CBG:  Recent Labs Lab 09/28/12 1716 09/28/12 2023 09/29/12 0022 09/29/12 0434 09/29/12 0751  GLUCAP 157* 166* 172* 162* 157*    Recent Results (from the past 240 hour(s))  MRSA PCR SCREENING     Status: None   Collection Time    09/28/12  3:51 PM      Result Value Range Status   MRSA by PCR NEGATIVE  NEGATIVE Final   Comment:            The GeneXpert MRSA Assay (FDA     approved for NASAL specimens     only), is one component of a     comprehensive MRSA colonization     surveillance program. It is not     intended to diagnose MRSA     infection nor to guide or     monitor treatment for     MRSA infections.     Studies:  Recent x-ray studies have been reviewed in detail by the Attending Physician    Junious Silk, ANP [facility] Office  [phone] Pager [phone]  **If unable to reach the above provider after paging please contact the Flow Manager @ [phone]  On-Call/Text Page:      [email or web address]      password [email or web address]  If 7PM-7AM, please contact night-coverage [email or web address] Password [facility] 09/29/2012, 1:39 PM   LOS: 1 day   I have examined the patient, reviewed the chart and modified the above note which I agree with.   Kerin Cecchi,MD [phone] 09/29/2012, 4:22 PM

## 2012-09-29 NOTE — Progress Notes (Signed)
Physical Therapy Evaluation Patient Details Name: Shaun Whitney MRN: [id number] DOB: 1931-02-06 Today's Date: 09/29/2012 Time: 4010-2725 PT Time Calculation (min): 10 min  PT Assessment / Plan / Recommendation History of Present Illness    Mr. Montini is a 79 yo WM with a hx of aortic stenosis, DM II, and hypertension who was transferred to [facility] today from [facility] at family request due to continued AMS and weakness. He was a pt at [facility] for same about a month ago then d/c'd to SNF. After 2 weeks at SNF, pt began to have weakness and AMS again and was taken to [facility] again then transferred here 09/28/12. He has had little intake over past 2 weeks as well. Came with Foley. Urine revealed + infection and pt placed on Zosyn and new Foley placed here. MRI brain at [facility] showed old stroke only. He was also found to have significant hypernatremia and acute renal failure.    Clinical Impression  Pt admitted with encephalopathy. Pt currently with functional limitations due to the deficits listed below (see PT Problem List). [facility] where pt resided prior to this admit.  Pt did not do much while there but the therapist says he had a rapid decline from her first session due to illness.  Will give pt a trial of PT to determine if progress can be made.   Pt will benefit from skilled PT to increase their independence and safety with mobility to allow discharge to the venue listed below.     PT Assessment  Patient needs continued PT services    Follow Up Recommendations  SNF;Supervision/Assistance - 24 hour         Barriers to Discharge Decreased caregiver support pt took care of wife PTA to NH in June    Equipment Recommendations  Other (comment) (TBA)         Frequency Min 2X/week    Precautions / Restrictions Precautions Precautions: Fall Restrictions Weight Bearing Restrictions: No   Pertinent Vitals/Pain VSS, No pain      Mobility  Bed Mobility Bed Mobility:  Rolling Right;Right Sidelying to Sit;Sitting - Scoot to [place] of Bed Rolling Right: 1: +2 Total assist Rolling Right: Patient Percentage: 10% Right Sidelying to Sit: 1: +2 Total assist;HOB flat Right Sidelying to Sit: Patient Percentage: 10% Sitting - Scoot to Edge of Bed: 1: +1 Total assist Details for Bed Mobility Assistance: Pt initiated movement minimally upon command but did not have the strength to make much of a difference.  Needed total assist to move LE off and on bed as well as elevate and decelerate trunk.   Transfers Transfers: Not assessed Ambulation/Gait Ambulation/Gait Assistance: Not tested (comment) Stairs: No Wheelchair Mobility Wheelchair Mobility: No         PT Diagnosis: Generalized weakness  PT Problem List: Decreased strength;Decreased activity tolerance;Decreased balance;Decreased mobility;Decreased knowledge of use of DME;Decreased safety awareness;Decreased knowledge of precautions PT Treatment Interventions: DME instruction;Functional mobility training;Therapeutic activities;Therapeutic exercise;Balance training;Neuromuscular re-education;Patient/family education     PT Goals(Current goals can be found in the care plan section) Acute Rehab PT Goals Patient Stated Goal: unable to state PT Goal Formulation: Patient unable to participate in goal setting Time For Goal Achievement: 10/13/12 Potential to Achieve Goals: Fair  Visit Information  Last PT Received On: 09/29/12 Assistance Needed: +2 PT/OT Co-Evaluation/Treatment: Yes       Prior Functioning  Home Living Family/patient expects to be discharged to:: Skilled nursing facility Additional Comments: Was receiving therapy at SNF at  [facility].  Had been there for 2 weeks.  [facility]ed [facility] and spoke with [facility], ST who relayed information from PT as well.  Pt was taking care of his wife at home in June.  Once he came into the hospital and went to SNF for therapy he began to decline a few days  after he got to NH per therapists there.  He was not feeding himself and was needing total assist for all mobility.  Therapist stated he was not even getting OOB last day or two there due to being sick.   Prior Function Level of Independence: Needs assistance Gait / Transfers Assistance Needed: total assist ADL's / Homemaking Assistance Needed: total assist Communication / Swallowing Assistance Needed: total assist Communication Communication:  (non verbal today)    Cognition  Cognition Arousal/Alertness: Lethargic Behavior During Therapy: Flat affect Overall Cognitive Status: Difficult to assess Difficult to assess due to: Level of arousal    Extremity/Trunk Assessment Upper Extremity Assessment Upper Extremity Assessment: Defer to OT evaluation Lower Extremity Assessment Lower Extremity Assessment: RLE deficits/detail;LLE deficits/detail RLE Deficits / Details: 1/5 movement noted LLE Deficits / Details: 1/5 movement noted Cervical / Trunk Assessment Cervical / Trunk Assessment: Kyphotic   Balance Balance Balance Assessed: Yes Static Sitting Balance Static Sitting - Balance Support: Bilateral upper extremity supported;Feet supported Static Sitting - Level of Assistance: 1: +1 Total assist;Patient percentage (comment) Static Sitting - Comment/# of Minutes: pt =0%.  Pt leaning right and anterior and with max cues and assist, never demonstrated trunk stability at all.  Sat a total of 5 minutes.    End of Session PT - End of Session Equipment Utilized During Treatment: Gait belt Activity Tolerance: Patient limited by fatigue;Patient limited by lethargy Patient left: in bed;with call bell/phone within reach;with bed alarm set Nurse Communication: Mobility status;Need for lift equipment       INGOLD,Candler Ginsberg 09/29/2012, 12:27 PM [facility] Acute [facility] [phone] (pager)

## 2012-09-29 NOTE — Progress Notes (Signed)
Off unit for vascular labe and EEG

## 2012-09-29 NOTE — Progress Notes (Signed)
"  curbside" consult with Dr. Herma Carson of PCCM about pt status, labs. He recommends NS bolus x 2 L even though sodium is high secondary to isotonic characteristics. After which, maintenance fluids of D5W or .45% saline. R/p BMP at 10am. Other than that, he had no suggestions at this time, but will not hesitate to call formal consult if needed.  Jimmye Norman, NP [facility]

## 2012-09-29 NOTE — Evaluation (Signed)
Clinical/Bedside Swallow Evaluation Patient Details  Name: Shaun Whitney MRN: [id number] Date of Birth: [birth year]  Today's Date: 09/29/2012 Time: 1355-1416 SLP Time Calculation (min): 21 min  Past Medical History:  Past Medical History  Diagnosis Date  . Aortic stenosis   . Diabetes mellitus type II   . Hypertension   . Gout   . GERD (gastroesophageal reflux disease)    Past Surgical History:  Past Surgical History  Procedure Laterality Date  . Joint replacement      right knee  . Tee without cardioversion  01/15/2012    Procedure: TRANSESOPHAGEAL ECHOCARDIOGRAM (TEE);  Surgeon: Laurey Morale, MD;  Location: [facility] ENDOSCOPY;  Service: Cardiovascular;  Laterality: N/A;   HPI:  79 y.o. male with h/o recent hospitalization to [facility] on 7/[facility] for confusion and generalized weakness was discharged to SNF with tegretol for possible focal seizures after 2 weeks stay at SNF, he became more somnolent, decreased po intake,. [facility] was [facility], underwent MRI of the brain , did not show any acute stroke, showed old strokes. He was found to be in acute renal failure and hyeprnatremic. He was treated for ARF and hypernatremia with fluids. But he remained confused and encephalopathic.  CXR revealed Suggestion of interstitial edema has resolved. There is some atelectasis at the left lung base. Heart size is stable and t the upper limits of normal. No significant pleural fluid is identified.   Question of  dysphagia/ no po intake over the last 2 to 3 decreased.  Family is discussing about feeding tube placement.  Per records faxed from SNF, pt. was consuming nectar thick liquids, diet texture unknown to this therapist.    Assessment / Plan / Recommendation Clinical Impression  Pt. appears to have aphasia at baseline (?) with inability to verbally (or non verbal) respond to questions.  Decreased sustained attention and easily distracted requiring total verbal/tactile/visual  assist for initiation of swallow with nectar thick with delayed swallow of approximately 1-2 minutes.  Initiated swallow with puree texture in more timely manner with decreased laryngeal elevation during palpation.  He is at high aspiration risk at present with any consistency at recommend that he remain NPO.  ST will return next date to assess for improvements.  Continue oral care with focus on hard palate to further remove dried mucous.     Aspiration Risk  Severe    Diet Recommendation NPO        Other  Recommendations Oral Care Recommendations: Oral care BID   Follow Up Recommendations  Skilled Nursing facility    Frequency and Duration min 2x/week  1 week   Pertinent Vitals/Pain No indications    SLP Swallow Goals Goal #3: Pt. will demonstrate a timely swallow with all consistencies with max verbal and visual assit to determine ability to initiate po's versus objective evaluation.    Swallow Study           Oral/Motor/Sensory Function Overall Oral Motor/Sensory Function:  (unable)   Ice Chips Ice chips: Not tested   Thin Liquid Thin Liquid: Not tested (on NTL at SNF)    Nectar Thick Nectar Thick Liquid: Impaired Presentation: Spoon Oral Phase Impairments: Reduced lingual movement/coordination;Impaired anterior to posterior transit;Poor awareness of bolus (severe delay) Pharyngeal Phase Impairments: Suspected delayed Swallow;Decreased hyoid-laryngeal movement   Honey Thick Honey Thick Liquid: Not tested   Puree Puree: Impaired Presentation: Spoon Oral Phase Impairments: Reduced lingual movement/coordination;Impaired anterior to posterior transit Pharyngeal Phase Impairments: Suspected delayed  Swallow;Decreased hyoid-laryngeal movement   Solid       Solid: Not tested       Royce Macadamia M.Ed [employer] [phone]  09/29/2012

## 2012-09-29 NOTE — Progress Notes (Addendum)
Shift events:  HPI: Shaun Whitney is a 79 yo WM with a hx of aortic stenosis, DM II, and hypertension who was transferred to [facility] today from [facility] at family request due to continued AMS and weakness. He was a pt at [facility] for same about a month ago then d/c'd to SNF. After 2 weeks at SNF, pt began to have weakness and AMS again and was taken to [facility] again then transferred here 09/28/12. He has had little intake over past 2 weeks as well. Came with Foley. Urine revealed + infection and pt placed on Zosyn and new Foley placed here. MRI brain at [facility] showed old stroke only. He was also found to have significant hypernatremia and acute renal failure. Started on IVF here, D5W. Has been pan cultured here.  RN has paged this NP a few times tonight, mostly for pt's fever. Tylenol PR was given with little success and Tylenol IV has brought his fever down some. Have given 250cc .45% bolus for fever. Now, his BP is soft in the 90s and previously he was 110-130 systolic. UO has slowed also. For renal US today. 500cc .45% bolus given now. Stat lab work ordered including r/p Lactic acid (previously normal). RN reports little to no response from pt tonight and non verbal. Left sided swelling, especially in LE. Last EF 65%. For LE doppler today to r/o DVT.  Shaun Whitney is an elderly WM who appears acute on chronically ill. He opens his eyes and looks at me when name called. He can tell me his name, but no other verbal response to questions. When asked if he is in pain, he shakes his head no. CARD: RRR, soft BP. RESP: normal effort. Lungs CTA. LLE with 3+ pitting edema.  Shaun Whitney is definitely ill with ? SIRS/sepsis and dehydration, ARF and hypernatremia. However, he is more awake and responsive at this time.  Will cont to bolus as tolerated to keep BP up. Watch urine output. Will await stat labs and follow tests. Cont IV abx empirically. Hold antihypertensives and renal offensive meds (last creat 1.61, baseline  normal in past). Should BP continue to fall and/or mental status/resp status worsen, will call PCCM for consult.   Jimmye Norman, NP [facility]

## 2012-09-29 NOTE — Evaluation (Signed)
Occupational Therapy Evaluation Patient Details Name: Shaun Whitney MRN: [id number] DOB: [birth year] Today's Date: 09/29/2012 Time: 4098-1191 OT Time Calculation (min): 13 min  OT Assessment / Plan / Recommendation History of present illness   Shaun Whitney is a 79 yo WM with a hx of aortic stenosis, DM II, and hypertension who was transferred to [facility] today from [facility] at family request due to continued AMS and weakness. He was a pt at [facility] for same about a month ago then d/c'd to SNF. After 2 weeks at SNF, pt began to have weakness and AMS again and was taken to [facility] again then transferred here 09/28/12. He has had little intake over past 2 weeks as well. Came with Foley. Urine revealed + infection and pt placed on Zosyn and new Foley placed here. MRI brain at [facility] showed old stroke only. He was also found to have significant hypernatremia and acute renal failure.     Clinical Impression   Pt admitted with encephalopathy. Pt from [facility]. PT spoke with therapist at [facility] center and reports pt had rapid decline while there. Will continue to follow pt acutely in order to address below problem list. Recommending SNF for d/c planning.    OT Assessment  Patient needs continued OT Services    Follow Up Recommendations  SNF    Barriers to Discharge      Equipment Recommendations  3 in 1 bedside comode    Recommendations for Other Services    Frequency  Min 2X/week    Precautions / Restrictions Precautions Precautions: Fall Restrictions Weight Bearing Restrictions: No   Pertinent Vitals/Pain See  vitals    ADL  Grooming: Performed;Supervision/safety Where Assessed - Grooming: Supported sitting Upper Body Bathing: Simulated;+1 Total assistance Where Assessed - Upper Body Bathing: Supine, head of bed up Lower Body Bathing: Simulated;+1 Total assistance Where Assessed - Lower Body Bathing: Supine, head of bed up Upper Body Dressing: Simulated;+1 Total  assistance Where Assessed - Upper Body Dressing: Supine, head of bed up Lower Body Dressing: Performed;+1 Total assistance Where Assessed - Lower Body Dressing: Supine, head of bed up ADL Comments: Pt able to initiate tasks 50% of time when given one step command but requires assist to perform task due to generalized weakness.    OT Diagnosis: Generalized weakness;Cognitive deficits  OT Problem List: Decreased strength;Decreased activity tolerance;Impaired balance (sitting and/or standing);Decreased cognition;Decreased knowledge of use of DME or AE OT Treatment Interventions: Self-care/ADL training;DME and/or AE instruction;Therapeutic activities;Cognitive remediation/compensation;Patient/family education;Balance training;Neuromuscular education   OT Goals(Current goals can be found in the care plan section) Acute Rehab OT Goals Patient Stated Goal: unable to state OT Goal Formulation: With patient Time For Goal Achievement: 10/13/12 Potential to Achieve Goals: Good  Visit Information  Last OT Received On: 09/29/12 Assistance Needed: +2 PT/OT Co-Evaluation/Treatment: Yes       Prior Functioning     Home Living Family/patient expects to be discharged to:: Skilled nursing facility Additional Comments: Was receiving therapy at SNF at [facility].  Had been there for 2 weeks.  [facility]ed [facility] and spoke with [facility], ST who relayed information from PT as well.  Pt was taking care of his wife at home in June.  Once he came into the hospital and went to SNF for therapy he began to decline a few days after he got to NH per therapists there.  He was not feeding himself and was needing total assist for all mobility.  Therapist stated he was not  even getting OOB last day or two there due to being sick.   Prior Function Level of Independence: Needs assistance Gait / Transfers Assistance Needed: total assist ADL's / Homemaking Assistance Needed: total assist Communication / Swallowing  Assistance Needed: total assist Communication Communication:  (non verbal today)         Vision/Perception Vision - Assessment Additional Comments: unable to fully assess due to impaired cognition. Will continue to assess.   Cognition  Cognition Arousal/Alertness: Lethargic Behavior During Therapy: Flat affect Overall Cognitive Status: Difficult to assess Difficult to assess due to: Level of arousal    Extremity/Trunk Assessment Upper Extremity Assessment Upper Extremity Assessment: Difficult to assess due to impaired cognition Lower Extremity Assessment Lower Extremity Assessment: RLE deficits/detail;LLE deficits/detail RLE Deficits / Details: 1/5 movement noted LLE Deficits / Details: 1/5 movement noted Cervical / Trunk Assessment Cervical / Trunk Assessment: Kyphotic     Mobility Bed Mobility Bed Mobility: Rolling Right;Right Sidelying to Sit;Sitting - Scoot to [place] of Bed Rolling Right: 1: +2 Total assist Rolling Right: Patient Percentage: 10% Right Sidelying to Sit: 1: +2 Total assist;HOB flat Right Sidelying to Sit: Patient Percentage: 10% Sitting - Scoot to Edge of Bed: 1: +1 Total assist Details for Bed Mobility Assistance: Pt initiated movement minimally upon command but did not have the strength to make much of a difference.  Needed total assist to move LE off and on bed as well as elevate and decelerate trunk.       Exercise     Balance Balance Balance Assessed: Yes Static Sitting Balance Static Sitting - Balance Support: Bilateral upper extremity supported;Feet supported Static Sitting - Level of Assistance: 1: +1 Total assist;Patient percentage (comment) Static Sitting - Comment/# of Minutes: pt=0%. Pt with right anterior lean sitting EOB ~5 minutes.   End of Session OT - End of Session Activity Tolerance: Patient limited by lethargy Patient left: in bed;with call bell/phone within reach;with bed alarm set Nurse Communication: Mobility status  GO    09/29/2012 Cipriano Mile OTR/L Pager [phone] Office [phone]   Cipriano Mile 09/29/2012, 12:51 PM

## 2012-09-30 DIAGNOSIS — I82409 Acute embolism and thrombosis of unspecified deep veins of unspecified lower extremity: Secondary | ICD-10-CM

## 2012-09-30 DIAGNOSIS — M79609 Pain in unspecified limb: Secondary | ICD-10-CM

## 2012-09-30 DIAGNOSIS — M7989 Other specified soft tissue disorders: Secondary | ICD-10-CM

## 2012-09-30 LAB — PROCALCITONIN: Procalcitonin: 0.93 ng/mL

## 2012-09-30 LAB — BASIC METABOLIC PANEL
CO2: 21 mEq/L (ref 19–32)
Chloride: 126 mEq/L — ABNORMAL HIGH (ref 96–112)
Creatinine, Ser: 1.92 mg/dL — ABNORMAL HIGH (ref 0.50–1.35)
Sodium: 157 mEq/L — ABNORMAL HIGH (ref 135–145)

## 2012-09-30 LAB — CBC
HCT: 31 % — ABNORMAL LOW (ref 39.0–52.0)
MCH: 34.2 pg — ABNORMAL HIGH (ref 26.0–34.0)
MCHC: 33.2 g/dL (ref 30.0–36.0)
Platelets: 133 10*3/uL — ABNORMAL LOW (ref 150–400)
RDW: 14.5 % (ref 11.5–15.5)

## 2012-09-30 LAB — URINALYSIS, ROUTINE W REFLEX MICROSCOPIC
Nitrite: NEGATIVE
Specific Gravity, Urine: 1.026 (ref 1.005–1.030)
pH: 5.5 (ref 5.0–8.0)

## 2012-09-30 LAB — GLUCOSE, CAPILLARY
Glucose-Capillary: 175 mg/dL — ABNORMAL HIGH (ref 70–99)
Glucose-Capillary: 233 mg/dL — ABNORMAL HIGH (ref 70–99)

## 2012-09-30 LAB — URINE MICROSCOPIC-ADD ON

## 2012-09-30 MED ORDER — ENOXAPARIN SODIUM 80 MG/0.8ML ~~LOC~~ SOLN
80.0000 mg | SUBCUTANEOUS | Status: DC
  Administered 2012-09-30 – 2012-10-01 (×2): 80 mg via SUBCUTANEOUS
  Filled 2012-09-30 (×3): qty 0.8

## 2012-09-30 MED ORDER — ACETAMINOPHEN 10 MG/ML IV SOLN: 500.0000 mg | Freq: Once | INTRAVENOUS | Status: DC

## 2012-09-30 MED ORDER — ACETAMINOPHEN 650 MG RE SUPP
650.0000 mg | RECTAL | Status: DC | PRN
  Administered 2012-09-30 – 2012-10-04 (×3): 650 mg via RECTAL
  Filled 2012-09-30 (×3): qty 1

## 2012-09-30 NOTE — Progress Notes (Signed)
ANTICOAGULATION CONSULT NOTE - Initial Consult  Pharmacy Consult for Lovenox Indication: DVT  No Known Allergies  Patient Measurements: Height: 5\' 9"  (175.3 cm) Weight: 184 lb 11.9 oz (83.8 kg) IBW/kg (Calculated) : 70.7  Vital Signs: Temp: 98.6 F (37 C) (07/31 1550) Temp src: Oral (07/31 1550) BP: 113/51 mmHg (07/31 1133) Pulse Rate: 67 (07/31 1133)  Labs:  Recent Labs  09/28/12 1744 09/28/12 2122 09/29/12 0205 09/29/12 1010 09/30/12 0438  HGB 11.9*  --  10.5*  --  10.3*  HCT 37.3*  --  31.6*  --  31.0*  PLT 146*  --  132*  --  133*  LABPROT 15.5*  --   --   --   --   INR 1.26  --   --   --   --   CREATININE 1.36*  --  1.78* 1.88* 1.92*  TROPONINI  --  <0.30  --   --   --     Estimated Creatinine Clearance: 30.7 ml/min (by C-G formula based on Cr of 1.92).   Medical History: Past Medical History  Diagnosis Date  . Aortic stenosis   . Diabetes mellitus type II   . Hypertension   . Gout   . GERD (gastroesophageal reflux disease)    Assessment: 79 year old male patient recently hospitalized at [facility] on 09/09/2012 for confusion and generalized weakness. Apparently it was felt the patient may have been having focal seizures and Tegretol was started during that admission. He was subsequently discharged to a skilled nursing facility. 2 weeks later patient developed increasing lethargy and decreased oral intake. He was readmitted to [facility] 09/26/2012. 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 He was found to be in acute renal failure with hypernatremia and was hydrated. He remained confused and encephalopathic.  Now with positive DVT  Goal of Therapy:  Anti-Xa level 0.6-1.2 units/ml 4hrs after LMWH dose given Monitor platelets by anticoagulation protocol: Yes   Plan:  1) Lovenox 80 mg sq Q 24 2) Watch renal function 3) Monitor CBC  Thank you. Okey Regal, PharmD [phone]  09/30/2012,3:57 PM

## 2012-09-30 NOTE — Progress Notes (Signed)
[facility] Progress Note [place] TEAM 1 - Stepdown/ICU TEAM   ANSEN SAYEGH WUJ:[id number] DOB: 10/09/1930 DOA: 09/28/2012 PCP: Kirstie Peri, MD  Brief narrative: 79 year old male patient recently hospitalized at [facility] on 09/09/2012 for confusion and generalized weakness. Apparently it was felt the patient may have been having focal seizures and Tegretol was started during that admission. He was subsequently discharged to a skilled nursing facility. 2 weeks later patient developed increasing lethargy and decreased oral intake. He was readmitted to [facility] 09/26/2012. 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 He was found to be in acute renal failure with hypernatremia and was hydrated. He remained confused and encephalopathic. Family spoke with the patient's primary care physician requested the patient be transferred to [facility] for possible neurology consultation.  Assessment/Plan: Active Problems:   Acute encephalopathy/SIRS -waking up -due to hyperNa+ and infection/fever -prior MRI reported as negative -also has sepsis from UTI which may be contributing -EEG c/w encephalopathy-unclear if truly has seizure d/o- may need formal neuro eval to comment on whether pre admit Tegretol needs to be continued    Dehydration with hypernatremia -Na trend down slowly after hydration -was on Lasix pre admit and if given in setting of poor intake contributed to pt's profound DH -follow lytes    Acute renal failure on CKD (chronic kidney disease) stage 3, GFR 30-59 ml/min -baseline Scr 1.18 with GFR around 60 -follow lytes after hydration -[place] normal kidneys      Chronic combined systolic and diastolic congestive heart failure -compensated    Hypokalemia -IV replete    Fever/leukocytosis/UTI -urine cx negative but will repeat UA and cx given persistent high fevers and worsening leukocytosis -no hypoxia so doubt PNA and abdominal exam unremarkable   -consider extensive DVT as etiology -continue Zosyn -if no definitive source found given AMS and high fever may need to obtain LP to r/o meningitis   LLE edema/acute DVT -venous duplex preliminary results with extensive DVT LLE -will increase Lovenox to therapeutic and pharmacy dosing   Stage 2 sacral decubitus -appreciate WOC RN eval -dose not appear infected   Mild Dysphagia -SLP eval rec D1 diet    HTN (hypertension) -improved but still soft    CAD (coronary artery disease) -EKG and TNI OK    Thrombocytopenia, unspecified -likely due to DVT    Anemia -hgb stable   DVT prophylaxis: Lovenox-upgraded to full dose 7/31 Code Status: Full Family Communication: No family at bedside upon evaluation Disposition Plan/Expected LOS: Remain in step down Isolation: None Nutritional Status: Suspect acute protein calorie malnutrition related to recent failure to thrive and likely underlying chronic reticular malnutrition related to dementia  Consultants: None  Procedures: EEG c/w encephalopathy and not seizures Left Lower extremity venous Doppler pending  Antibiotics: Zosyn 7/29 >>>  HPI/Subjective: Patient opens eyes and mumbles verbal responses. Makes eye contact.   Objective: Blood pressure 110/68, pulse 67, temperature 99 F (37.2 C), temperature source Oral, resp. rate 17, height 5\' 9"  (1.753 m), weight 83.8 kg (184 lb 11.9 oz), SpO2 98.00%.  Intake/Output Summary (Last 24 hours) at 09/30/12 1233 Last data filed at 09/30/12 1125  Gross per 24 hour  Intake   1550 ml  Output    675 ml  Net    875 ml     Exam: General: No acute respiratory distress Lungs: Clear to auscultation bilaterally without wheezes or crackles, RA Cardiovascular: Regular rate and rhythm without murmur gallop or rub normal S1 and S2,  unilateral peripheral edema left leg without JVD Abdomen: Nontender, nondistended, soft, bowel sounds positive, no rebound, no ascites, no appreciable  mass Musculoskeletal: No significant cyanosis, clubbing of bilateral lower extremities Neurological: Awakens but remains lethargic  Scheduled Meds: Scheduled Meds: . enoxaparin (LOVENOX) injection  40 mg Subcutaneous Q24H  . insulin aspart  0-9 Units Subcutaneous Q4H  . piperacillin-tazobactam (ZOSYN)  IV  3.375 g Intravenous Q8H  . sodium chloride  3 mL Intravenous Q12H   Continuous Infusions: . dextrose 125 mL/hr at 09/30/12 0532    **Reviewed in detail by the Attending Physician   Data Reviewed: Basic Metabolic Panel:  Recent Labs Lab 09/28/12 1744 09/29/12 0205 09/29/12 1010 09/30/12 0438  NA 163* 163* 161* 157*  K 3.4* 3.3* 3.3* 3.2*  CL 128* 131* 130* 126*  CO2 20 20 22 21   GLUCOSE 158* 175* 179* 221*  BUN 34* 41* 41* 35*  CREATININE 1.36* 1.78* 1.88* 1.92*  CALCIUM 9.3 8.9 8.7 8.8  MG 2.1  --   --   --   PHOS 2.7  --   --   --    Liver Function Tests:  Recent Labs Lab 09/28/12 1744  AST 29  ALT 28  ALKPHOS 291*  BILITOT 3.6*  PROT 6.8  ALBUMIN 2.3*   No results found for this basename: LIPASE, AMYLASE,  in the last 168 hours  Recent Labs Lab 09/28/12 2121  AMMONIA 24   CBC:  Recent Labs Lab 09/28/12 1744 09/29/12 0205 09/30/12 0438  WBC 17.1* 17.8* 20.0*  NEUTROABS 13.7*  --   --   HGB 11.9* 10.5* 10.3*  HCT 37.3* 31.6* 31.0*  MCV 104.8* 103.3* 103.0*  PLT 146* 132* 133*   Cardiac Enzymes:  Recent Labs Lab 09/28/12 2122  TROPONINI <0.30   BNP (last 3 results) No results found for this basename: PROBNP,  in the last 8760 hours CBG:  Recent Labs Lab 09/29/12 2009 09/30/12 0014 09/30/12 0410 09/30/12 0756 09/30/12 1155  GLUCAP 194* 175* 203* 179* 233*    Recent Results (from the past 240 hour(s))  MRSA PCR SCREENING     Status: None   Collection Time    09/28/12  3:51 PM      Result Value Range Status   MRSA by PCR NEGATIVE  NEGATIVE Final   Comment:            The GeneXpert MRSA Assay (FDA     approved for NASAL  specimens     only), is one component of a     comprehensive MRSA colonization     surveillance program. It is not     intended to diagnose MRSA     infection nor to guide or     monitor treatment for     MRSA infections.  URINE CULTURE     Status: None   Collection Time    09/28/12  6:18 PM      Result Value Range Status   Specimen Description URINE, CLEAN CATCH   Final   Special Requests NONE   Final   Culture  Setup Time 09/28/2012 21:39   Final   Colony Count NO GROWTH   Final   Culture NO GROWTH   Final   Report Status 09/29/2012 FINAL   Final  CULTURE, BLOOD (ROUTINE X 2)     Status: None   Collection Time    09/28/12  9:15 PM      Result Value Range Status   Specimen Description BLOOD  ARM LEFT   Final   Special Requests BOTTLES DRAWN AEROBIC AND ANAEROBIC 10CC   Final   Culture  Setup Time 09/29/2012 03:33   Final   Culture     Final   Value:        BLOOD CULTURE RECEIVED NO GROWTH TO DATE CULTURE WILL BE HELD FOR 5 DAYS BEFORE ISSUING A FINAL NEGATIVE REPORT   Report Status PENDING   Incomplete  CULTURE, BLOOD (ROUTINE X 2)     Status: None   Collection Time    09/28/12  9:25 PM      Result Value Range Status   Specimen Description BLOOD HAND RIGHT   Final   Special Requests BOTTLES DRAWN AEROBIC ONLY 10CC   Final   Culture  Setup Time 09/29/2012 03:33   Final   Culture     Final   Value:        BLOOD CULTURE RECEIVED NO GROWTH TO DATE CULTURE WILL BE HELD FOR 5 DAYS BEFORE ISSUING A FINAL NEGATIVE REPORT   Report Status PENDING   Incomplete     Studies:  Recent x-ray studies have been reviewed in detail by the Attending Physician    Junious Silk, ANP [facility] Office  [phone] Pager [phone]  **If unable to reach the above provider after paging please contact the Flow Manager @ [phone]  On-Call/Text Page:      [email or web address]      password [email or web address]  If 7PM-7AM, please contact night-coverage [email or web address] Password [facility] 09/30/2012, 12:33  PM   LOS: 2 days   I have examined the patient, reviewed the chart and modified the above note which I agree with.   Saranne Crislip,MD [phone] 09/30/2012, 4:21 PM

## 2012-09-30 NOTE — Procedures (Signed)
EEG NUMBER:  REFERRING PHYSICIAN:  Kathlen Mody, MD.  HISTORY:  An 79 year old male with altered mental status, weakness, and possible seizures.  MEDICATIONS:  NovoLog, Zosyn, Lovenox.  CONDITIONS OF RECORDING:  This is a 16-channel EEG carried out with the patient in the awake state.  DESCRIPTION:  The posterior background rhythm at its maximum is a 7-8 Hz activity that is a fairly well organized and seen over both Hemispheres. It is not well-sustained and during the majority of the tracing the posterior background rhythm is often slower.  There is a decrease in alpha activity seen in the central and temporal regions with theta activity being most predominant.  There are some faster rhythms noted anteriorly.  Stage II sleep was not obtained.   Hyperventilation and intermittent photic stimulation were not performed.  IMPRESSION:  This EEG is characterized by a general background slowing. This may be seen with normal drowse, but cannot rule out the possibility of a diffuse cerebral disturbance such as a metabolic encephalopathy among other possibilities.  No epileptiform activity is noted.          ______________________________ Thana Farr, MD    [id number] D:  09/29/2012 18:23:03  T:  09/30/2012 05:49:26  Job #:  [id number]

## 2012-09-30 NOTE — Progress Notes (Signed)
Speech Language Pathology Dysphagia Treatment Patient Details Name: Shaun Whitney MRN: [id number] DOB: 05/04/1930 Today's Date: 09/30/2012 Time: 0940-1000 SLP Time Calculation (min): 20 min  Assessment / Plan / Recommendation Clinical Impression  Mr. Nolt is more awake this morning, following some basic commands with mild verbal and visual cues.  Oral care via SLP removed dried mucous from hard palate (decreased from yesterday) and from tongue.  Oral phase was prolonged with nectar liquids with suspected delay to valleculae or pyriform sinuses.  He swallowed 1-2 extra times possibly to clear oral cavity and or pharynx.  Pt. self feds with tactile assist to load spoon and steadying assist.  He required total tactile cues with cup due to consuming large amounts.  Recommend Dys 1 and nectar thick with pills whole in applesauce, FULL assist, oral care prior to po's, check oral cavity at end of meals, no straws.  ST will follow for safety and efficiency.      Diet Recommendation  Initiate / Change Diet: Dysphagia 1 (puree);Nectar-thick liquid    SLP Plan Continue with current plan of care   Pertinent Vitals/Pain Yes, grimacing at times, possibly due to positioning.  Pt. Aphasic.  SLP repositioned and he appeared relaxed.   Swallowing Goals  SLP Swallowing Goals Patient will utilize recommended strategies during swallow to increase swallowing safety with: Moderate cueing Goal #3: Pt. will demonstrate a timely swallow with all consistencies with max verbal and visual assit to determine ability to initiate po's versus objective evaluation.  Swallow Study Goal #3 - Progress: Met  General Temperature Spikes Noted: Yes Respiratory Status: Room air Behavior/Cognition: Alert;Cooperative;Pleasant mood;Requires cueing Oral Cavity - Dentition: Adequate natural dentition Patient Positioning: Upright in bed  Oral Cavity - Oral Hygiene Does patient have any of the following "at risk" factors?: Other -  dysphagia;Tongue - coated;Saliva - thick, dry mouth (dried mucous hard palate) Brush patient's teeth BID with toothbrush (using toothpaste with fluoride): Yes Patient is HIGH RISK - Oral Care Protocol followed (see row info): Yes   Dysphagia Treatment Treatment focused on: Skilled observation of diet tolerance;Facilitation of oral preparatory phase;Facilitation of pharyngeal phase;Facilitation of oral phase Treatment Methods/Modalities: Skilled observation Patient observed directly with PO's: Yes Type of PO's observed: Dysphagia 1 (puree);Nectar-thick liquids Feeding: Able to feed self;Needs assist;Needs set up Liquids provided via: Cup;No straw Oral Phase Signs & Symptoms: Prolonged bolus formation;Prolonged oral phase (delay with nectar) Pharyngeal Phase Signs & Symptoms: Suspected delayed swallow initiation;Multiple swallows Type of cueing: Verbal;Tactile;Visual Amount of cueing: Maximal   GO     Breck Coons Jhonny Calixto M.Ed [employer] [phone]  09/30/2012

## 2012-09-30 NOTE — Progress Notes (Signed)
VASCULAR LAB PRELIMINARY  PRELIMINARY  PRELIMINARY  PRELIMINARY  Left lower extremity venous Doppler completed.    Preliminary report:  There is acute, occlusive DVT throughout the left lower extremity, beginning in the posterior tibial, popliteal, femoral, and common femoral veins.  There is no propagation to the right lower extremity.  Brendin Situ, RVT 09/30/2012, 3:10 PM

## 2012-10-01 ENCOUNTER — Encounter (HOSPITAL_COMMUNITY): Payer: Self-pay | Admitting: *Deleted

## 2012-10-01 LAB — GLUCOSE, CAPILLARY
Glucose-Capillary: 241 mg/dL — ABNORMAL HIGH (ref 70–99)
Glucose-Capillary: 177 mg/dL — ABNORMAL HIGH (ref 70–99)
Glucose-Capillary: 232 mg/dL — ABNORMAL HIGH (ref 70–99)

## 2012-10-01 LAB — CBC
HCT: 27.8 % — ABNORMAL LOW (ref 39.0–52.0)
Hemoglobin: 9.6 g/dL — ABNORMAL LOW (ref 13.0–17.0)
MCH: 34 pg (ref 26.0–34.0)
MCHC: 34.5 g/dL (ref 30.0–36.0)
MCV: 98.6 fL (ref 78.0–100.0)
RDW: 13.9 % (ref 11.5–15.5)

## 2012-10-01 LAB — COMPREHENSIVE METABOLIC PANEL
ALT: 32 U/L (ref 0–53)
Alkaline Phosphatase: 301 U/L — ABNORMAL HIGH (ref 39–117)
BUN: 27 mg/dL — ABNORMAL HIGH (ref 6–23)
CO2: 21 mEq/L (ref 19–32)
Calcium: 8.7 mg/dL (ref 8.4–10.5)
GFR calc Af Amer: 40 mL/min — ABNORMAL LOW (ref >90)
GFR calc non Af Amer: 34 mL/min — ABNORMAL LOW (ref >90)
Glucose, Bld: 202 mg/dL — ABNORMAL HIGH (ref 70–99)
Total Protein: 6.3 g/dL (ref 6.0–8.3)

## 2012-10-01 LAB — CBC WITH DIFFERENTIAL/PLATELET
Basophils Absolute: 0.1 10*3/uL (ref 0.0–0.1)
Basophils Relative: 0 % (ref 0–1)
HCT: 28.8 % — ABNORMAL LOW (ref 39.0–52.0)
Hemoglobin: 9.9 g/dL — ABNORMAL LOW (ref 13.0–17.0)
Lymphocytes Relative: 16 % (ref 12–46)
MCHC: 34.4 g/dL (ref 30.0–36.0)
Monocytes Absolute: 0.7 10*3/uL (ref 0.1–1.0)
Neutro Abs: 13.8 10*3/uL — ABNORMAL HIGH (ref 1.7–7.7)
Neutrophils Relative %: 78 % — ABNORMAL HIGH (ref 43–77)
RDW: 14.2 % (ref 11.5–15.5)
WBC: 17.5 10*3/uL — ABNORMAL HIGH (ref 4.0–10.5)

## 2012-10-01 LAB — PROCALCITONIN: Procalcitonin: 0.78 ng/mL

## 2012-10-01 LAB — URINE CULTURE
Colony Count: NO GROWTH
Culture: NO GROWTH

## 2012-10-01 MED ORDER — INSULIN ASPART 100 UNIT/ML ~~LOC~~ SOLN
0.0000 [IU] | Freq: Three times a day (TID) | SUBCUTANEOUS | Status: DC
  Administered 2012-10-01: 8 [IU] via SUBCUTANEOUS
  Administered 2012-10-02: 3 [IU] via SUBCUTANEOUS
  Administered 2012-10-02 – 2012-10-03 (×3): 5 [IU] via SUBCUTANEOUS
  Administered 2012-10-03: 3 [IU] via SUBCUTANEOUS
  Administered 2012-10-03 – 2012-10-04 (×2): 5 [IU] via SUBCUTANEOUS
  Administered 2012-10-04: 2 [IU] via SUBCUTANEOUS
  Administered 2012-10-04: 5 [IU] via SUBCUTANEOUS
  Administered 2012-10-05: 3 [IU] via SUBCUTANEOUS
  Administered 2012-10-05: 5 [IU] via SUBCUTANEOUS
  Administered 2012-10-05: 3 [IU] via SUBCUTANEOUS
  Administered 2012-10-06: 2 [IU] via SUBCUTANEOUS
  Administered 2012-10-06 (×2): 3 [IU] via SUBCUTANEOUS

## 2012-10-01 MED ORDER — POTASSIUM CHLORIDE CRYS ER 20 MEQ PO TBCR
20.0000 meq | EXTENDED_RELEASE_TABLET | ORAL | Status: AC
  Administered 2012-10-01 (×3): 20 meq via ORAL
  Filled 2012-10-01 (×3): qty 1

## 2012-10-01 MED ORDER — STARCH (THICKENING) PO POWD
ORAL | Status: DC | PRN
  Filled 2012-10-01: qty 227

## 2012-10-01 MED ORDER — INSULIN GLARGINE 100 UNIT/ML ~~LOC~~ SOLN
10.0000 [IU] | Freq: Every day | SUBCUTANEOUS | Status: DC
  Filled 2012-10-01: qty 0.1

## 2012-10-01 MED ORDER — INSULIN ASPART 100 UNIT/ML ~~LOC~~ SOLN
0.0000 [IU] | Freq: Every day | SUBCUTANEOUS | Status: DC
  Administered 2012-10-01 – 2012-10-10 (×5): 2 [IU] via SUBCUTANEOUS

## 2012-10-01 NOTE — Progress Notes (Addendum)
[facility] Progress Note [place] TEAM 1 - Stepdown/ICU TEAM   FARHAN JEAN ZOX:[id number] DOB: 08-21-1930 DOA: 09/28/2012 PCP: Kirstie Peri, MD  Brief narrative: 79 year old male patient recently hospitalized at [facility] on 09/09/2012 for confusion and generalized weakness. Apparently it was felt the patient may have been having focal seizures and Tegretol was started during that admission. He was subsequently discharged to a skilled nursing facility. 2 weeks later patient developed increasing lethargy and decreased oral intake. He was readmitted to [facility] 09/26/2012. 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 He was found to be in acute renal failure with hypernatremia and was hydrated. He remained confused and encephalopathic. Family spoke with the patient's primary care physician requested the patient be transferred to [facility] for possible neurology consultation.  Assessment/Plan: Active Problems:   Acute encephalopathy -due to hyperNa+ and fevers -prior MRI reported as negative -EEG c/w encephalopathy -waking up but still minimally verbal     Dehydration with hypernatremia -Na trend down slowly after hydration -was on Lasix pre admit     Fever/leukocytosis -Negative chest x-ray - negative ultrasound abdomen -Positive UA but negative urine culture therefore, can d/c Zosyn today - fever and leukocytosis also may be related to new found DVT - discussed with ID- they agree DVT can cause the amount of fever and leukocytosis in this pt.  - if no improvement in the next 2-3 days despite the treatment of the DVT, will need to continue work up of infectious soure (possible CT chest abd pelvis)    LLE edema/acute DVT -venous duplex preliminary results with extensive DVT LLE -Placed on full dose Lovenox 7/31 - if hgb stable on Lovenox, switch to Xarelto in next few days    Acute renal failure on CKD (chronic kidney disease) stage 3, GFR 30-59  ml/min -baseline Scr 1.18 with GFR around 60 - likely due to dehydration -[place] normal kidneys      Chronic diastolic congestive heart failure -Follows with [place] [facility]ology -compensated    Hypokalemia - replete - check mag   Stage 2 sacral decubitus -appreciate WOC RN eval -dose not appear infected   Mild Dysphagia -SLP eval rec D1 diet  Hyperglycemia -Last A1c 6.5 blood sugars now significantly elevated do to being on D5 water -Will increase sliding scale to moderate dose    HTN (hypertension) -improved but not enough to resume Coreg    CAD (coronary artery disease) -Stable    Thrombocytopenia, unspecified -likely due to DVT    Anemia -hgb stable  Recently started on Tegretol for suspected focal seizures-has been on hold due to lethargy  DVT prophylaxis: Lovenox-upgraded to full dose 7/31 Code Status: Full Family Communication: No family at bedside upon evaluation Disposition Plan/Expected LOS: transfer to med/surg- will likely need SNF Isolation: None Nutritional Status: Suspect acute protein calorie malnutrition related to recent failure to thrive and likely underlying chronic reticular malnutrition related to dementia  Consultants: None  Procedures: EEG c/w encephalopathy and not seizures Left Lower extremity venous Doppler pending  Antibiotics: Zosyn 7/29 >>>  HPI/Subjective: Patient opens eyes and mumbles verbal responses. Makes eye contact.   Objective: Blood pressure 122/63, pulse 73, temperature 99.3 F (37.4 C), temperature source Rectal, resp. rate 17, height 5\' 9"  (1.753 m), weight 83.8 kg (184 lb 11.9 oz), SpO2 99.00%.  Intake/Output Summary (Last 24 hours) at 10/01/12 1430 Last data filed at 10/01/12 1200  Gross per 24 hour  Intake   4140 ml  Output  901 ml  Net   3239 ml     Exam: General: No acute respiratory distress Lungs: Clear to auscultation bilaterally without wheezes or crackles, RA Cardiovascular: Regular rate and  rhythm without murmur gallop or rub normal S1 and S2, unilateral peripheral edema left leg without JVD Abdomen: Nontender, nondistended, soft, bowel sounds positive, no rebound, no ascites, no appreciable mass Musculoskeletal: No significant cyanosis, clubbing of bilateral lower extremities Neurological: Awakens but not very verbal Extremities- swollen left leg  Scheduled Meds: Scheduled Meds: . enoxaparin (LOVENOX) injection  80 mg Subcutaneous Q24H  . insulin aspart  0-9 Units Subcutaneous Q4H  . piperacillin-tazobactam (ZOSYN)  IV  3.375 g Intravenous Q8H  . sodium chloride  3 mL Intravenous Q12H   Continuous Infusions: . dextrose 125 mL/hr at 10/01/12 1200    **Reviewed in detail by the Attending Physician   Data Reviewed: Basic Metabolic Panel:  Recent Labs Lab 09/28/12 1744 09/29/12 0205 09/29/12 1010 09/30/12 0438 10/01/12 0516  NA 163* 163* 161* 157* 152*  K 3.4* 3.3* 3.3* 3.2* 2.6*  CL 128* 131* 130* 126* 119*  CO2 20 20 22 21 21   GLUCOSE 158* 175* 179* 221* 202*  BUN 34* 41* 41* 35* 27*  CREATININE 1.36* 1.78* 1.88* 1.92* 1.79*  CALCIUM 9.3 8.9 8.7 8.8 8.7  MG 2.1  --   --   --   --   PHOS 2.7  --   --   --   --    Liver Function Tests:  Recent Labs Lab 09/28/12 1744 10/01/12 0516  AST 29 38*  ALT 28 32  ALKPHOS 291* 301*  BILITOT 3.6* 2.0*  PROT 6.8 6.3  ALBUMIN 2.3* 1.8*   No results found for this basename: LIPASE, AMYLASE,  in the last 168 hours  Recent Labs Lab 09/28/12 2121  AMMONIA 24   CBC:  Recent Labs Lab 09/28/12 1744 09/29/12 0205 09/30/12 0438 10/01/12 0516  WBC 17.1* 17.8* 20.0* 17.5*  NEUTROABS 13.7*  --   --  13.8*  HGB 11.9* 10.5* 10.3* 9.9*  HCT 37.3* 31.6* 31.0* 28.8*  MCV 104.8* 103.3* 103.0* 100.0  PLT 146* 132* 133* 156   Cardiac Enzymes:  Recent Labs Lab 09/28/12 2122  TROPONINI <0.30   BNP (last 3 results) No results found for this basename: PROBNP,  in the last 8760 hours CBG:  Recent Labs Lab  09/30/12 2012 10/01/12 0009 10/01/12 0441 10/01/12 0756 10/01/12 1134  GLUCAP 241* 200* 177* 226* 232*    Recent Results (from the past 240 hour(s))  MRSA PCR SCREENING     Status: None   Collection Time    09/28/12  3:51 PM      Result Value Range Status   MRSA by PCR NEGATIVE  NEGATIVE Final   Comment:            The GeneXpert MRSA Assay (FDA     approved for NASAL specimens     only), is one component of a     comprehensive MRSA colonization     surveillance program. It is not     intended to diagnose MRSA     infection nor to guide or     monitor treatment for     MRSA infections.  URINE CULTURE     Status: None   Collection Time    09/28/12  6:18 PM      Result Value Range Status   Specimen Description URINE, CLEAN CATCH   Final   Special  Requests NONE   Final   Culture  Setup Time 09/28/2012 21:39   Final   Colony Count NO GROWTH   Final   Culture NO GROWTH   Final   Report Status 09/29/2012 FINAL   Final  CULTURE, BLOOD (ROUTINE X 2)     Status: None   Collection Time    09/28/12  9:15 PM      Result Value Range Status   Specimen Description BLOOD ARM LEFT   Final   Special Requests BOTTLES DRAWN AEROBIC AND ANAEROBIC 10CC   Final   Culture  Setup Time 09/29/2012 03:33   Final   Culture     Final   Value:        BLOOD CULTURE RECEIVED NO GROWTH TO DATE CULTURE WILL BE HELD FOR 5 DAYS BEFORE ISSUING A FINAL NEGATIVE REPORT   Report Status PENDING   Incomplete  CULTURE, BLOOD (ROUTINE X 2)     Status: None   Collection Time    09/28/12  9:25 PM      Result Value Range Status   Specimen Description BLOOD HAND RIGHT   Final   Special Requests BOTTLES DRAWN AEROBIC ONLY 10CC   Final   Culture  Setup Time 09/29/2012 03:33   Final   Culture     Final   Value:        BLOOD CULTURE RECEIVED NO GROWTH TO DATE CULTURE WILL BE HELD FOR 5 DAYS BEFORE ISSUING A FINAL NEGATIVE REPORT   Report Status PENDING   Incomplete  URINE CULTURE     Status: None   Collection Time     09/30/12 10:28 AM      Result Value Range Status   Specimen Description URINE, RANDOM   Final   Special Requests NONE   Final   Culture  Setup Time 09/30/2012 11:35   Final   Colony Count NO GROWTH   Final   Culture NO GROWTH   Final   Report Status 10/01/2012 FINAL   Final     Studies:  Recent x-ray studies have been reviewed in detail by the Attending Physician   Jullianna Gabor,MD [phone] 10/01/2012, 2:30 PM

## 2012-10-01 NOTE — Progress Notes (Signed)
Inpatient Diabetes Program Recommendations  AACE/ADA: New Consensus Statement on Inpatient Glycemic Control (2013)  Target Ranges:  Prepandial:   less than 140 mg/dL      Peak postprandial:   less than 180 mg/dL (1-2 hours)      Critically ill patients:  140 - 180 mg/dL   Reason for Assessmemt: Hyperglycemia  Results for Shaun Whitney, Shaun Whitney (MRN [id number]) as of 10/01/2012 14:32  Ref. Range 09/30/2012 11:55 09/30/2012 15:48 09/30/2012 17:40 09/30/2012 20:12 10/01/2012 00:09 10/01/2012 04:41 10/01/2012 07:56 10/01/2012 11:34  Glucose-Capillary Latest Range: 70-99 mg/dL 409 (H) 811 (H) 914 (H) 241 (H) 200 (H) 177 (H) 226 (H) 232 (H)   Note: Despite sensitive correction, CBG's sub-optimal.  Please consider increasing correction to moderate scale unless contraindicated.  Thank you.  Davelle Anselmi S. Elsie Lincoln, RN, CNS, CDE Inpatient Diabetes Program, team pager [phone]

## 2012-10-01 NOTE — Progress Notes (Signed)
Occupational Therapy Treatment Patient Details Name: Shaun Whitney MRN: [id number] DOB: 1930/06/07 Today's Date: 10/01/2012 Time: 4098-1191 OT Time Calculation (min): 14 min  OT Assessment / Plan / Recommendation  History of present illness Pt admit with acute encephalopathy.   OT comments  Focus of session on EOB sitting balance and activity tolerance. Pt continues to requires heavy +2 total assist for all aspects of bed mobility and sitting balance. Continue to recommend SNF.  Follow Up Recommendations  SNF    Barriers to Discharge       Equipment Recommendations  3 in 1 bedside comode    Recommendations for Other Services    Frequency Min 2X/week   Progress towards OT Goals Progress towards OT goals: Progressing toward goals  Plan Discharge plan remains appropriate    Precautions / Restrictions Precautions Precautions: Fall Restrictions Weight Bearing Restrictions: No   Pertinent Vitals/Pain See vitals    ADL  ADL Comments: Focus of session on EOB sitting balance and activity tolerance.    OT Diagnosis:    OT Problem List:   OT Treatment Interventions:     OT Goals(current goals can now be found in the care plan section) Acute Rehab OT Goals Patient Stated Goal: unable to state OT Goal Formulation: With patient Time For Goal Achievement: 10/13/12 Potential to Achieve Goals: Good ADL Goals Pt Will Perform Upper Body Bathing: with mod assist;sitting Pt Will Perform Lower Body Bathing: with mod assist;sitting/lateral leans Pt Will Transfer to Toilet: with max assist;bedside commode;squat pivot transfer Additional ADL Goal #1: Pt will perform static sitting balance task >5 minutes at min assist level as precursor for EOB ADLs. Additional ADL Goal #2: Pt will perform bed mobility with min assist as precursor for EOB ADLs.   Visit Information  Last OT Received On: 10/01/12 Assistance Needed: +2 History of Present Illness: Pt admit with acute encephalopathy.     Subjective Data      Prior Functioning       Cognition  Cognition Arousal/Alertness: Lethargic Behavior During Therapy: Flat affect Overall Cognitive Status: Impaired/Different from baseline Area of Impairment: Attention Current Attention Level: Focused Difficult to assess due to: Level of arousal    Mobility  Bed Mobility Bed Mobility: Rolling Right;Right Sidelying to Sit;Sitting - Scoot to [place] of Bed Rolling Right: 1: +2 Total assist Rolling Right: Patient Percentage: 0% Right Sidelying to Sit: 1: +2 Total assist;HOB flat Right Sidelying to Sit: Patient Percentage: 0% Sitting - Scoot to Edge of Bed: 1: +1 Total assist Details for Bed Mobility Assistance: Pt initiated movement minimally upon command but did not have the strength to make much of a difference.  Needed total assist to move LE off and on bed as well as elevate and decelerate trunk.      Exercises      Balance Balance Balance Assessed: Yes Static Sitting Balance Static Sitting - Balance Support: Bilateral upper extremity supported;Feet supported Static Sitting - Level of Assistance: 1: +2 Total assist;Patient percentage (comment) Static Sitting - Comment/# of Minutes: Sat EOB ~7 minutes. Pt with heavy right anterior lean.  Pt with truncal intiation x1 for <5 seconds.   End of Session OT - End of Session Activity Tolerance: Patient limited by lethargy Patient left: in bed;with call bell/phone within reach;with bed alarm set Nurse Communication: Mobility status;Need for lift equipment  GO    10/01/2012 Cipriano Mile OTR/L Pager [phone] Office [phone]   Cipriano Mile 10/01/2012, 4:02 PM

## 2012-10-01 NOTE — Progress Notes (Signed)
Speech Language Pathology Dysphagia Treatment Patient Details Name: Shaun Whitney MRN: [id number] DOB: 1930/05/09 Today's Date: 10/01/2012 Time: 1500-1510 SLP Time Calculation (min): 10 min  Assessment / Plan / Recommendation Clinical Impression  Pt. seen for skilled dysphagia therapy with son arriving at the end.  RN did not report problems with swallowing but decreased intake.  No indications of aspiration with nectar liquids, vocal quality clear.  He required moderate-max multimodal cues for small sips.  Recommend continue Dys 1 and nectar and ST will continue to follow.    Diet Recommendation  Continue with Current Diet: Dysphagia 1 (puree);Nectar-thick liquid    SLP Plan Continue with current plan of care   Pertinent Vitals/Pain Yes, grimace, repositioned   Swallowing Goals  SLP Swallowing Goals Patient will utilize recommended strategies during swallow to increase swallowing safety with: Moderate cueing Swallow Study Goal #2 - Progress: Progressing toward goal  General Temperature Spikes Noted: Yes Respiratory Status: Room air Behavior/Cognition: Alert;Cooperative;Requires cueing Oral Cavity - Dentition: Adequate natural dentition Patient Positioning: Upright in bed  Oral Cavity - Oral Hygiene Does patient have any of the following "at risk" factors?: Other - dysphagia;Tongue - coated;Saliva - thick, dry mouth Brush patient's teeth BID with toothbrush (using toothpaste with fluoride): Yes Patient is HIGH RISK - Oral Care Protocol followed (see row info): Yes   Dysphagia Treatment Treatment focused on: Skilled observation of diet tolerance;Patient/family/caregiver Dealer Educated: son Treatment Methods/Modalities: Skilled observation Patient observed directly with PO's: Yes Type of PO's observed: Nectar-thick liquids Feeding: Able to feed self;Needs set up;Needs assist Liquids provided via: Cup;No straw Type of cueing: Verbal;Tactile;Visual Amount of  cueing: Moderate   GO     Breck Coons [place].Ed [employer] [phone]  10/01/2012

## 2012-10-01 NOTE — Progress Notes (Signed)
Physical Therapy Treatment Patient Details Name: Shaun Whitney MRN: [id number] DOB: 01-Aug-1930 Today's Date: 10/01/2012 Time: 1145-1200 PT Time Calculation (min): 15 min  PT Assessment / Plan / Recommendation  History of Present Illness Pt admit with acute encephalopathy.   PT Comments   Pt admitted with encephalopathy. Pt currently with functional limitations due to decr endurance and poor motor control.  Pt appears to have right hemibody preference and is distracted so easily that it is difficult for pt to participate in therapy.   Pt will benefit from skilled PT to increase their independence and safety with mobility to allow discharge to the venue listed below.   Follow Up Recommendations  SNF;Supervision/Assistance - 24 hour                 Equipment Recommendations  Other (comment) (TBA)        Frequency Min 2X/week   Progress towards PT Goals Progress towards PT goals: Not progressing toward goals - comment (Pt without change since last treatment overall)  Plan Current plan remains appropriate    Precautions / Restrictions Precautions Precautions: Fall Restrictions Weight Bearing Restrictions: No   Pertinent Vitals/Pain VSS, No pain    Mobility  Bed Mobility Bed Mobility: Rolling Right;Right Sidelying to Sit;Sitting - Scoot to [place] of Bed Rolling Right: 1: +2 Total assist Rolling Right: Patient Percentage: 0% Right Sidelying to Sit: 1: +2 Total assist;HOB flat Right Sidelying to Sit: Patient Percentage: 0% Sitting - Scoot to Edge of Bed: 1: +1 Total assist Details for Bed Mobility Assistance: Pt initiated movement minimally upon command but did not have the strength to make much of a difference.  Needed total assist to move LE off and on bed as well as elevate and decelerate trunk.   Transfers Transfers: Not assessed Ambulation/Gait Ambulation/Gait Assistance: Not tested (comment) Stairs: No Wheelchair Mobility Wheelchair Mobility: No     PT Goals (current  goals can now be found in the care plan section)    Visit Information  Last PT Received On: 10/01/12 Assistance Needed: +2 PT/OT Co-Evaluation/Treatment: Yes History of Present Illness: Pt admit with acute encephalopathy.    Subjective Data  Subjective: Very little verbalizations   Cognition  Cognition Arousal/Alertness: Lethargic Behavior During Therapy: Flat affect Overall Cognitive Status: Difficult to assess Difficult to assess due to: Level of arousal    Balance  Static Sitting Balance Static Sitting - Balance Support: Bilateral upper extremity supported;Feet supported Static Sitting - Level of Assistance: 1: +2 Total assist;Patient percentage (comment) (pt =10%) Static Sitting - Comment/# of Minutes: Pt sat for 7 minutes leaning right and anterior with max cues and assist, never demonstrating any trunk stability.  Pt only assisted 10% x 1 and it was for 3 seconds at best.    End of Session PT - End of Session Activity Tolerance: Patient limited by fatigue;Patient limited by lethargy Patient left: in bed;with call bell/phone within reach;with bed alarm set (left in 50 degree chair position in bed) Nurse Communication: Mobility status;Need for lift equipment       INGOLD,Klye Besecker 10/01/2012, 1:31 PM [facility] [phone] (pager)

## 2012-10-01 NOTE — Progress Notes (Signed)
Report called to Selena Batten, receiving RN on 6E.  Pt transferred to 6E11 via bed with all belongings. Called pt's family member, Lorin Picket, with new room number.    Roselie Awkward, RN

## 2012-10-02 DIAGNOSIS — N179 Acute kidney failure, unspecified: Secondary | ICD-10-CM

## 2012-10-02 LAB — GLUCOSE, CAPILLARY: Glucose-Capillary: 237 mg/dL — ABNORMAL HIGH (ref 70–99)

## 2012-10-02 LAB — BASIC METABOLIC PANEL
BUN: 20 mg/dL (ref 6–23)
CO2: 20 mEq/L (ref 19–32)
Chloride: 110 mEq/L (ref 96–112)
Creatinine, Ser: 1.58 mg/dL — ABNORMAL HIGH (ref 0.50–1.35)
GFR calc Af Amer: 46 mL/min — ABNORMAL LOW (ref >90)
Potassium: 2.5 mEq/L — CL (ref 3.5–5.1)

## 2012-10-02 LAB — CBC
HCT: 27.4 % — ABNORMAL LOW (ref 39.0–52.0)
MCV: 98.6 fL (ref 78.0–100.0)
RBC: 2.78 MIL/uL — ABNORMAL LOW (ref 4.22–5.81)
WBC: 11.5 10*3/uL — ABNORMAL HIGH (ref 4.0–10.5)

## 2012-10-02 MED ORDER — RESOURCE THICKENUP CLEAR PO POWD
ORAL | Status: DC | PRN
  Filled 2012-10-02 (×2): qty 125

## 2012-10-02 MED ORDER — POTASSIUM CHLORIDE CRYS ER 20 MEQ PO TBCR
40.0000 meq | EXTENDED_RELEASE_TABLET | ORAL | Status: AC
  Administered 2012-10-02 (×2): 40 meq via ORAL
  Filled 2012-10-02 (×2): qty 2

## 2012-10-02 MED ORDER — ENOXAPARIN SODIUM 80 MG/0.8ML ~~LOC~~ SOLN
80.0000 mg | Freq: Two times a day (BID) | SUBCUTANEOUS | Status: DC
  Administered 2012-10-02 – 2012-10-07 (×9): 80 mg via SUBCUTANEOUS
  Filled 2012-10-02 (×13): qty 0.8

## 2012-10-02 NOTE — Progress Notes (Signed)
N.O received for d/c of cooling blanket. To manage with suppository tylenol. Dondra Spry

## 2012-10-02 NOTE — Progress Notes (Signed)
CRITICAL VALUE ALERT  Critical value received:  Potassium = 2.5  Date of notification:  10/02/2012  Time of notification:  0705  Critical value read back:yes  Nurse who received alert:  K. Christell Constant, RN   MD notified (1st page):  Dr. Butler Denmark  Time of first page:  [phone]  MD notified (2nd page):  Time of second page:

## 2012-10-02 NOTE — Progress Notes (Signed)
ANTICOAGULATION CONSULT NOTE - Follow Up Consult  Pharmacy Consult for Lovenox Indication: DVT  No Known Allergies  Patient Measurements: Height: 5\' 9"  (175.3 cm) Weight: 187 lb 6.3 oz (85 kg) IBW/kg (Calculated) : 70.7   Vital Signs: Temp: 98.6 F (37 C) (08/02 0400) Temp src: Oral (08/02 0400) BP: 132/54 mmHg (08/02 0400) Pulse Rate: 66 (08/02 0400)  Labs:  Recent Labs  09/30/12 0438 10/01/12 0516 10/01/12 1814 10/02/12 0530 10/02/12 0908  HGB 10.3* 9.9* 9.6*  --  9.3*  HCT 31.0* 28.8* 27.8*  --  27.4*  PLT 133* 156 154  --  167  CREATININE 1.92* 1.79*  --  1.58*  --     Estimated Creatinine Clearance: 40.3 ml/min (by C-G formula based on Cr of 1.58).   Assessment: 82 YOM being treated for LLE DVT, encephalopathy, and AoCKD. His SCr improved to 1.58 today with a CrCl of ~78mL/min. H/H fell slightly again, platelets remain stable. Na is now WNL, K is low- supplementation has been ordered. No bleeding noted.  Goal of Therapy:  Anti-Xa level 0.6-1.2 units/ml 4hrs after LMWH dose given Monitor platelets by anticoagulation protocol: Yes   Plan:  1. Increase Lovenox to 80mg  subq Q12hr starting at 1700 2. CBC Q72hr 3. F/u renal function, s/s bleeding, clinical progression, transition to oral anticoagulants  Kanoa Phillippi D. Ramesha Poster, PharmD Clinical Pharmacist Pager: [phone] 10/02/2012 10:59 AM

## 2012-10-02 NOTE — Progress Notes (Addendum)
[facility] Progress Note [place] TEAM 1 - Stepdown/ICU TEAM   Shaun Whitney WUJ:[id number] DOB: June 08, 1930 DOA: 09/28/2012 PCP: Kirstie Peri, MD  Brief narrative: 79 year old male patient recently hospitalized at [facility] on 09/09/2012 for confusion and generalized weakness. Apparently it was felt the patient may have been having focal seizures and Tegretol was started during that admission. He was subsequently discharged to a skilled nursing facility. 2 weeks later patient developed increasing lethargy and decreased oral intake. He was readmitted to [facility] 09/26/2012. 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 He was found to be in acute renal failure with hypernatremia and was hydrated. He remained confused and encephalopathic. Family spoke with the patient's primary care physician requested the patient be transferred to [facility] for possible neurology consultation.  Assessment/Plan: Active Problems:   Acute encephalopathy -due to hyperNa+ and fevers -prior MRI reported as negative -EEG c/w encephalopathy -alert, oriented and back to baseline today due to normal sodium    Dehydration with hypernatremia -resolved with IV fluids (D5) -was on Lasix pre admit     Fever/leukocytosis -Negative chest x-ray - negative ultrasound abdomen -Positive UA but negative urine culture therefore, Zosyn d/c'd on 8/1 - possibly from DVT   LLE edema/acute DVT -venous duplex preliminary results with extensive DVT LLE -Placed on full dose Lovenox 7/31 - if hgb stable on Lovenox and pt stable on his feet, switch to Xarelto in next few days    Acute renal failure on CKD (chronic kidney disease) stage 3, GFR 30-59 ml/min -baseline Scr 1.18 with GFR around 60 - likely due to dehydration - granular cast in UA suggest ATN as well -[place] normal kidneys      Chronic diastolic congestive heart failure -Follows with [place] [facility] -compensated    Hypokalemia -  replete - normal mag   Stage 2 sacral decubitus -appreciate WOC RN eval -dose not appear infected   Mild Dysphagia -SLP eval rec D1 diet  Hyperglycemia -Last A1c 6.5 blood sugars now significantly elevated do to being on D5 water -d/c D5 today as sodium normalized    HTN (hypertension) -improved but not enough to resume Coreg    CAD (coronary artery disease) -Stable    Thrombocytopenia, unspecified -likely due to DVT and now recovered    Anemia -hgb stable  Recently started on Tegretol for suspected focal seizures-has been on hold due to lethargy  DVT prophylaxis: Lovenox-upgraded to full dose 7/31 Code Status: Full Family Communication: No family at bedside upon evaluation Disposition Plan/Expected LOS: transfer to med/surg- will likely need SNF Isolation: None Nutritional Status: Suspect acute protein calorie malnutrition related to recent failure to thrive and likely underlying chronic reticular malnutrition related to dementia  Consultants: None  Procedures: EEG c/w encephalopathy and not seizures Left Lower extremity venous Doppler pending  Antibiotics: Zosyn 7/29 >>>  HPI/Subjective: Patient alert and oriented to place and person-  knows he is at [place]- has no complaints.    Objective: Blood pressure 132/54, pulse 66, temperature 98.6 F (37 C), temperature source Oral, resp. rate 20, height 5\' 9"  (1.753 m), weight 85 kg (187 lb 6.3 oz), SpO2 97.00%.  Intake/Output Summary (Last 24 hours) at 10/02/12 1520 Last data filed at 10/02/12 1100  Gross per 24 hour  Intake   1620 ml  Output   1077 ml  Net    543 ml     Exam: General: No acute respiratory distress Lungs: Clear to auscultation bilaterally without wheezes or  crackles, RA Cardiovascular: Regular rate and rhythm without murmur gallop or rub normal S1 and S2, unilateral peripheral edema left leg without JVD Abdomen: Nontender, nondistended, soft, bowel sounds positive, no rebound, no  ascites, no appreciable mass Musculoskeletal: No significant cyanosis, clubbing of bilateral lower extremities Neurological: Awakens, alert - unable to lift legs off bed- normal strength in arms Extremities- swollen left leg  Scheduled Meds: Scheduled Meds: . enoxaparin (LOVENOX) injection  80 mg Subcutaneous Q12H  . insulin aspart  0-15 Units Subcutaneous TID WC  . insulin aspart  0-5 Units Subcutaneous QHS  . sodium chloride  3 mL Intravenous Q12H   Continuous Infusions: . dextrose 125 mL/hr at 10/02/12 0505    **Reviewed in detail by the Attending Physician   Data Reviewed: Basic Metabolic Panel:  Recent Labs Lab 09/28/12 1744 09/29/12 0205 09/29/12 1010 09/30/12 0438 10/01/12 0516 10/01/12 1814 10/01/12 1956 10/02/12 0530  NA 163* 163* 161* 157* 152*  --   --  144  K 3.4* 3.3* 3.3* 3.2* 2.6*  --  2.8* 2.5*  CL 128* 131* 130* 126* 119*  --   --  110  CO2 20 20 22 21 21   --   --  20  GLUCOSE 158* 175* 179* 221* 202*  --   --  224*  BUN 34* 41* 41* 35* 27*  --   --  20  CREATININE 1.36* 1.78* 1.88* 1.92* 1.79*  --   --  1.58*  CALCIUM 9.3 8.9 8.7 8.8 8.7  --   --  8.2*  MG 2.1  --   --   --   --  2.0  --   --   PHOS 2.7  --   --   --   --   --   --   --    Liver Function Tests:  Recent Labs Lab 09/28/12 1744 10/01/12 0516  AST 29 38*  ALT 28 32  ALKPHOS 291* 301*  BILITOT 3.6* 2.0*  PROT 6.8 6.3  ALBUMIN 2.3* 1.8*   No results found for this basename: LIPASE, AMYLASE,  in the last 168 hours  Recent Labs Lab 09/28/12 2121  AMMONIA 24   CBC:  Recent Labs Lab 09/28/12 1744 09/29/12 0205 09/30/12 0438 10/01/12 0516 10/01/12 1814 10/02/12 0908  WBC 17.1* 17.8* 20.0* 17.5* 14.4* 11.5*  NEUTROABS 13.7*  --   --  13.8*  --   --   HGB 11.9* 10.5* 10.3* 9.9* 9.6* 9.3*  HCT 37.3* 31.6* 31.0* 28.8* 27.8* 27.4*  MCV 104.8* 103.3* 103.0* 100.0 98.6 98.6  PLT 146* 132* 133* 156 154 167   Cardiac Enzymes:  Recent Labs Lab 09/28/12 2122  TROPONINI  <0.30   BNP (last 3 results) No results found for this basename: PROBNP,  in the last 8760 hours CBG:  Recent Labs Lab 10/01/12 1542 10/01/12 1710 10/01/12 2145 10/02/12 0733 10/02/12 1117  GLUCAP 270* 248* 202* 227* 242*    Recent Results (from the past 240 hour(s))  MRSA PCR SCREENING     Status: None   Collection Time    09/28/12  3:51 PM      Result Value Range Status   MRSA by PCR NEGATIVE  NEGATIVE Final   Comment:            The GeneXpert MRSA Assay (FDA     approved for NASAL specimens     only), is one component of a     comprehensive MRSA colonization  surveillance program. It is not     intended to diagnose MRSA     infection nor to guide or     monitor treatment for     MRSA infections.  URINE CULTURE     Status: None   Collection Time    09/28/12  6:18 PM      Result Value Range Status   Specimen Description URINE, CLEAN CATCH   Final   Special Requests NONE   Final   Culture  Setup Time 09/28/2012 21:39   Final   Colony Count NO GROWTH   Final   Culture NO GROWTH   Final   Report Status 09/29/2012 FINAL   Final  CULTURE, BLOOD (ROUTINE X 2)     Status: None   Collection Time    09/28/12  9:15 PM      Result Value Range Status   Specimen Description BLOOD ARM LEFT   Final   Special Requests BOTTLES DRAWN AEROBIC AND ANAEROBIC 10CC   Final   Culture  Setup Time 09/29/2012 03:33   Final   Culture     Final   Value:        BLOOD CULTURE RECEIVED NO GROWTH TO DATE CULTURE WILL BE HELD FOR 5 DAYS BEFORE ISSUING A FINAL NEGATIVE REPORT   Report Status PENDING   Incomplete  CULTURE, BLOOD (ROUTINE X 2)     Status: None   Collection Time    09/28/12  9:25 PM      Result Value Range Status   Specimen Description BLOOD HAND RIGHT   Final   Special Requests BOTTLES DRAWN AEROBIC ONLY 10CC   Final   Culture  Setup Time 09/29/2012 03:33   Final   Culture     Final   Value:        BLOOD CULTURE RECEIVED NO GROWTH TO DATE CULTURE WILL BE HELD FOR 5 DAYS  BEFORE ISSUING A FINAL NEGATIVE REPORT   Report Status PENDING   Incomplete  URINE CULTURE     Status: None   Collection Time    09/30/12 10:28 AM      Result Value Range Status   Specimen Description URINE, RANDOM   Final   Special Requests NONE   Final   Culture  Setup Time 09/30/2012 11:35   Final   Colony Count NO GROWTH   Final   Culture NO GROWTH   Final   Report Status 10/01/2012 FINAL   Final     Studies:  Recent x-ray studies have been reviewed in detail by the Attending Physician   Salam Chesterfield,MD [phone] 10/02/2012, 3:20 PM

## 2012-10-03 LAB — BASIC METABOLIC PANEL
BUN: 14 mg/dL (ref 6–23)
CO2: 24 mEq/L (ref 19–32)
Chloride: 108 mEq/L (ref 96–112)
Glucose, Bld: 185 mg/dL — ABNORMAL HIGH (ref 70–99)
Potassium: 2.6 mEq/L — CL (ref 3.5–5.1)

## 2012-10-03 LAB — MAGNESIUM: Magnesium: 1.9 mg/dL (ref 1.5–2.5)

## 2012-10-03 LAB — GLUCOSE, CAPILLARY

## 2012-10-03 MED ORDER — POTASSIUM CHLORIDE CRYS ER 20 MEQ PO TBCR
40.0000 meq | EXTENDED_RELEASE_TABLET | ORAL | Status: AC
  Administered 2012-10-03 (×2): 40 meq via ORAL
  Filled 2012-10-03 (×2): qty 2

## 2012-10-03 MED ORDER — POTASSIUM CHLORIDE CRYS ER 20 MEQ PO TBCR
40.0000 meq | EXTENDED_RELEASE_TABLET | Freq: Three times a day (TID) | ORAL | Status: DC
  Administered 2012-10-03 – 2012-10-06 (×7): 40 meq via ORAL
  Filled 2012-10-03 (×11): qty 2

## 2012-10-03 NOTE — Progress Notes (Signed)
CRITICAL VALUE ALERT  Critical value received:  K 2.6  Date of notification:  10/03/12  Time of notification:  0644  Critical value read back:yes  Nurse who received alert:  C.Shanea Karney, RN  MD notified (1st page):  M.Burnadette Peter, NP  Time of first page:  0645  MD notified (2nd page):  Time of second page:  Responding MD:  M. Burnadette Peter, NP  Time MD responded:  [phone]

## 2012-10-03 NOTE — Progress Notes (Signed)
[facility] Progress Note [place] TEAM 1 - Stepdown/ICU TEAM   Shaun Whitney:[id number] DOB: 1931-01-18 DOA: 09/28/2012 PCP: Kirstie Peri, MD  Brief narrative: 79 year old male patient recently hospitalized at [facility] on 09/09/2012 for confusion and generalized weakness. Apparently it was felt the patient may have been having focal seizures and Tegretol was started during that admission. He was subsequently discharged to a skilled nursing facility. 2 weeks later patient developed increasing lethargy and decreased oral intake. He was readmitted to [facility] 09/26/2012. 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 He was found to be in acute renal failure with hypernatremia and was hydrated. He remained confused and encephalopathic. Family spoke with the patient's primary care physician requested the patient be transferred to [facility] for possible neurology consultation.  Assessment/Plan: Active Problems:   Acute encephalopathy -due to hyperNa+ and fevers -prior MRI reported as negative -EEG c/w encephalopathy -alert, oriented and back to baseline today due to normal sodium    Dehydration with hypernatremia -resolved with IV fluids (D5) -was on Lasix pre admit     Fever/leukocytosis -Negative chest x-ray - negative ultrasound abdomen -Positive UA but negative urine culture therefore, Zosyn d/c'd on 8/1 - possibly from DVT   LLE edema/acute DVT -venous duplex preliminary results with extensive DVT LLE -Placed on full dose Lovenox 7/31 - if hgb stable on Lovenox and pt stable on his feet, switch to Xarelto in next few days    Acute renal failure on CKD (chronic kidney disease) stage 3, GFR 30-59 ml/min -baseline Scr 1.18 with GFR around 60 - likely due to dehydration - granular cast in UA suggest ATN as well -[place] normal kidneys      Chronic diastolic congestive heart failure -Follows with [facility] -compensated    Hypokalemia - is  not improving with replacement - no signs of diarrhea, eating well, normal mag - replete more aggressively    Stage 2 sacral decubitus -appreciate WOC RN eval -dose not appear infected   Mild Dysphagia -SLP eval rec D1 diet  Hyperglycemia -Last A1c 6.5 blood sugars now significantly elevated do to being on D5 water -d/c D5 today as sodium normalized    HTN (hypertension) -improved but not enough to resume Coreg    CAD (coronary artery disease) -Stable    Thrombocytopenia, unspecified -likely due to DVT and now recovered    Anemia -hgb stable  Recently started on Tegretol for suspected focal seizures-has been on hold due to lethargy  DVT prophylaxis: Lovenox-upgraded to full dose 7/31 Code Status: Full Family Communication: No family at bedside upon evaluation Disposition Plan/Expected LOS: transfer to med/surg- will likely need SNF Isolation: None Nutritional Status: Suspect acute protein calorie malnutrition related to recent failure to thrive and likely underlying chronic reticular malnutrition related to dementia  Consultants: None  Procedures: EEG c/w encephalopathy and not seizures Left Lower extremity venous Doppler pending  Antibiotics: Zosyn 7/29 >>>  HPI/Subjective: Patient alert and oriented to place and person-  knows he is at [place]- has no complaints.    Objective: Blood pressure 126/46, pulse 78, temperature 98.9 F (37.2 C), temperature source Oral, resp. rate 18, height 5\' 9"  (1.753 m), weight 87.2 kg (192 lb 3.9 oz), SpO2 99.00%.  Intake/Output Summary (Last 24 hours) at 10/03/12 1654 Last data filed at 10/03/12 0916  Gross per 24 hour  Intake    360 ml  Output   3150 ml  Net  -2790 ml  Exam: General: No acute respiratory distress Lungs: Clear to auscultation bilaterally without wheezes or crackles, RA Cardiovascular: Regular rate and rhythm without murmur gallop or rub normal S1 and S2, unilateral peripheral edema left leg  without JVD Abdomen: Nontender, nondistended, soft, bowel sounds positive, no rebound, no ascites, no appreciable mass Musculoskeletal: No significant cyanosis, clubbing of bilateral lower extremities Neurological: Awakens, alert - unable to lift legs off bed- normal strength in arms Extremities- swollen left leg  Scheduled Meds: Scheduled Meds: . enoxaparin (LOVENOX) injection  80 mg Subcutaneous Q12H  . insulin aspart  0-15 Units Subcutaneous TID WC  . insulin aspart  0-5 Units Subcutaneous QHS  . sodium chloride  3 mL Intravenous Q12H   Continuous Infusions:    **Reviewed in detail by the Attending Physician   Data Reviewed: Basic Metabolic Panel:  Recent Labs Lab 09/28/12 1744  09/29/12 1010 09/30/12 0438 10/01/12 0516 10/01/12 1814 10/01/12 1956 10/02/12 0530 10/03/12 0435  NA 163*  < > 161* 157* 152*  --   --  144 142  K 3.4*  < > 3.3* 3.2* 2.6*  --  2.8* 2.5* 2.6*  CL 128*  < > 130* 126* 119*  --   --  110 108  CO2 20  < > 22 21 21   --   --  20 24  GLUCOSE 158*  < > 179* 221* 202*  --   --  224* 185*  BUN 34*  < > 41* 35* 27*  --   --  20 14  CREATININE 1.36*  < > 1.88* 1.92* 1.79*  --   --  1.58* 1.29  CALCIUM 9.3  < > 8.7 8.8 8.7  --   --  8.2* 8.6  MG 2.1  --   --   --   --  2.0  --   --  1.9  PHOS 2.7  --   --   --   --   --   --   --   --   < > = values in this interval not displayed. Liver Function Tests:  Recent Labs Lab 09/28/12 1744 10/01/12 0516  AST 29 38*  ALT 28 32  ALKPHOS 291* 301*  BILITOT 3.6* 2.0*  PROT 6.8 6.3  ALBUMIN 2.3* 1.8*   No results found for this basename: LIPASE, AMYLASE,  in the last 168 hours  Recent Labs Lab 09/28/12 2121  AMMONIA 24   CBC:  Recent Labs Lab 09/28/12 1744 09/29/12 0205 09/30/12 0438 10/01/12 0516 10/01/12 1814 10/02/12 0908  WBC 17.1* 17.8* 20.0* 17.5* 14.4* 11.5*  NEUTROABS 13.7*  --   --  13.8*  --   --   HGB 11.9* 10.5* 10.3* 9.9* 9.6* 9.3*  HCT 37.3* 31.6* 31.0* 28.8* 27.8* 27.4*   MCV 104.8* 103.3* 103.0* 100.0 98.6 98.6  PLT 146* 132* 133* 156 154 167   Cardiac Enzymes:  Recent Labs Lab 09/28/12 2122  TROPONINI <0.30   BNP (last 3 results) No results found for this basename: PROBNP,  in the last 8760 hours CBG:  Recent Labs Lab 10/02/12 1117 10/02/12 1634 10/02/12 2215 10/03/12 0750 10/03/12 1129  GLUCAP 242* 194* 237* 176* 243*    Recent Results (from the past 240 hour(s))  MRSA PCR SCREENING     Status: None   Collection Time    09/28/12  3:51 PM      Result Value Range Status   MRSA by PCR NEGATIVE  NEGATIVE Final   Comment:  The GeneXpert MRSA Assay (FDA     approved for NASAL specimens     only), is one component of a     comprehensive MRSA colonization     surveillance program. It is not     intended to diagnose MRSA     infection nor to guide or     monitor treatment for     MRSA infections.  URINE CULTURE     Status: None   Collection Time    09/28/12  6:18 PM      Result Value Range Status   Specimen Description URINE, CLEAN CATCH   Final   Special Requests NONE   Final   Culture  Setup Time 09/28/2012 21:39   Final   Colony Count NO GROWTH   Final   Culture NO GROWTH   Final   Report Status 09/29/2012 FINAL   Final  CULTURE, BLOOD (ROUTINE X 2)     Status: None   Collection Time    09/28/12  9:15 PM      Result Value Range Status   Specimen Description BLOOD ARM LEFT   Final   Special Requests BOTTLES DRAWN AEROBIC AND ANAEROBIC 10CC   Final   Culture  Setup Time 09/29/2012 03:33   Final   Culture     Final   Value:        BLOOD CULTURE RECEIVED NO GROWTH TO DATE CULTURE WILL BE HELD FOR 5 DAYS BEFORE ISSUING A FINAL NEGATIVE REPORT   Report Status PENDING   Incomplete  CULTURE, BLOOD (ROUTINE X 2)     Status: None   Collection Time    09/28/12  9:25 PM      Result Value Range Status   Specimen Description BLOOD HAND RIGHT   Final   Special Requests BOTTLES DRAWN AEROBIC ONLY 10CC   Final   Culture  Setup  Time 09/29/2012 03:33   Final   Culture     Final   Value:        BLOOD CULTURE RECEIVED NO GROWTH TO DATE CULTURE WILL BE HELD FOR 5 DAYS BEFORE ISSUING A FINAL NEGATIVE REPORT   Report Status PENDING   Incomplete  URINE CULTURE     Status: None   Collection Time    09/30/12 10:28 AM      Result Value Range Status   Specimen Description URINE, RANDOM   Final   Special Requests NONE   Final   Culture  Setup Time 09/30/2012 11:35   Final   Colony Count NO GROWTH   Final   Culture NO GROWTH   Final   Report Status 10/01/2012 FINAL   Final     Studies:  Recent x-ray studies have been reviewed in detail by the Attending Physician   Duey Liller,MD [phone] 10/03/2012, 4:54 PM

## 2012-10-04 LAB — CBC
MCH: 33.6 pg (ref 26.0–34.0)
MCV: 97.2 fL (ref 78.0–100.0)
Platelets: 258 10*3/uL (ref 150–400)
RBC: 3.18 MIL/uL — ABNORMAL LOW (ref 4.22–5.81)
RDW: 13.6 % (ref 11.5–15.5)

## 2012-10-04 LAB — URINALYSIS, ROUTINE W REFLEX MICROSCOPIC
Nitrite: NEGATIVE
Specific Gravity, Urine: 1.018 (ref 1.005–1.030)
Urobilinogen, UA: 1 mg/dL (ref 0.0–1.0)

## 2012-10-04 LAB — URINE MICROSCOPIC-ADD ON

## 2012-10-04 LAB — GLUCOSE, CAPILLARY
Glucose-Capillary: 222 mg/dL — ABNORMAL HIGH (ref 70–99)
Glucose-Capillary: 257 mg/dL — ABNORMAL HIGH (ref 70–99)

## 2012-10-04 LAB — BLOOD GAS, ARTERIAL
Acid-Base Excess: 2 mmol/L (ref 0.0–2.0)
Drawn by: 257081
O2 Content: 2 L/min
Patient temperature: 102.7
TCO2: 25.8 mmol/L (ref 0–100)
pCO2 arterial: 34.6 mmHg — ABNORMAL LOW (ref 35.0–45.0)
pH, Arterial: 7.479 — ABNORMAL HIGH (ref 7.350–7.450)

## 2012-10-04 MED ORDER — INSULIN DETEMIR 100 UNIT/ML ~~LOC~~ SOLN
10.0000 [IU] | Freq: Every day | SUBCUTANEOUS | Status: DC
  Administered 2012-10-04 – 2012-10-05 (×2): 10 [IU] via SUBCUTANEOUS
  Filled 2012-10-04 (×3): qty 0.1

## 2012-10-04 MED ORDER — POTASSIUM CHLORIDE IN NACL 20-0.9 MEQ/L-% IV SOLN
INTRAVENOUS | Status: DC
  Administered 2012-10-04 – 2012-10-14 (×15): via INTRAVENOUS
  Filled 2012-10-04 (×23): qty 1000

## 2012-10-04 MED ORDER — ACETAMINOPHEN 10 MG/ML IV SOLN
1000.0000 mg | Freq: Once | INTRAVENOUS | Status: DC
Start: 2012-10-04 — End: 2012-10-04

## 2012-10-04 MED ORDER — PIPERACILLIN-TAZOBACTAM 3.375 G IVPB
3.3750 g | Freq: Three times a day (TID) | INTRAVENOUS | Status: DC
  Administered 2012-10-04 – 2012-10-07 (×9): 3.375 g via INTRAVENOUS
  Filled 2012-10-04 (×10): qty 50

## 2012-10-04 MED ORDER — VANCOMYCIN HCL 1000 MG IV SOLR
750.0000 mg | Freq: Two times a day (BID) | INTRAVENOUS | Status: DC
  Administered 2012-10-04 – 2012-10-07 (×6): 750 mg via INTRAVENOUS
  Filled 2012-10-04 (×7): qty 750

## 2012-10-04 MED ORDER — CARVEDILOL 12.5 MG PO TABS
12.5000 mg | ORAL_TABLET | Freq: Two times a day (BID) | ORAL | Status: DC
  Administered 2012-10-04 – 2012-10-15 (×20): 12.5 mg via ORAL
  Filled 2012-10-04 (×24): qty 1

## 2012-10-04 MED ORDER — ACETAMINOPHEN 650 MG RE SUPP
650.0000 mg | RECTAL | Status: DC | PRN
  Administered 2012-10-05: 650 mg via RECTAL
  Filled 2012-10-04: qty 1

## 2012-10-04 MED ORDER — POTASSIUM CHLORIDE 10 MEQ/100ML IV SOLN
10.0000 meq | INTRAVENOUS | Status: AC
  Administered 2012-10-04 (×4): 10 meq via INTRAVENOUS
  Filled 2012-10-04 (×4): qty 100

## 2012-10-04 MED ORDER — ENSURE PUDDING PO PUDG
1.0000 | Freq: Three times a day (TID) | ORAL | Status: DC
  Administered 2012-10-04 – 2012-10-15 (×25): 1 via ORAL

## 2012-10-04 NOTE — Progress Notes (Signed)
[facility] Progress Note [place] TEAM 1 - Stepdown/ICU TEAM   JONAH GINGRAS ZOX:[id number] DOB: 1930/06/25 DOA: 09/28/2012 PCP: Kirstie Peri, MD  Brief narrative: 79 year old male patient recently hospitalized at [facility] on 09/09/2012 for confusion and generalized weakness. Apparently it was felt the patient may have been having focal seizures and Tegretol was started during that admission. He was subsequently discharged to a SNF. 2 weeks later patient developed increasing lethargy and decreased oral intake. He was readmitted to [facility] 09/26/2012. 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 He was found to be in acute renal failure with hypernatremia and was hydrated. He remained confused and encephalopathic. Family spoke with the patient's primary care physician and requested the patient be transferred to [facility].  Assessment/Plan:  Acute toxic metabolic encephalopathy -?due to hyperNa+ and fevers - Na has now normalized  -prior MRI reported as negative -EEG c/w encephalopathy, but w/ no evidence of seizure activity/foci -pt more alert in general, but remains quite lethargic at times and is not oriented to exam today   Dehydration with hypernatremia -resolved with IV fluids  -was on Lasix pre admit   Fever/leukocytosis -negative chest x-ray -negative ultrasound abdomen -positive UA but negative urine culture therefore, Zosyn d/c'd on 8/1, however, failure of the urine cx to identify an organism does not necessarily indicate that a true UTI is not present - I will therefore resume empiric abx as pt has had low grade temp elevation today to 99.1  LLE edema/acute DVT -venous duplex preliminary results with extensive DVT LLE -placed on full dose Lovenox 7/31 -consider switching to Xarelto when reliably able to take orals   Acute renal failure on CKD (chronic kidney disease) stage 3, GFR 30-59 ml/min -baseline Scr 1.18 with GFR around 60 -likely  due to dehydration - granular cast in UA suggest ATN as well -[place] normal kidneys -recheck BMET in AM    Chronic diastolic congestive heart failure -Follows with [facility]  -compensated at present   Hypokalemia -is not improving with replacement -no signs of diarrhea - normal magnesium -replete more aggressively -follow   Stage 2 sacral decubitus -appreciate WOC RN eval -does not appear infected  Mild Dysphagia -SLP eval rec D1 diet w/ nectar liquids  Hyperglycemia -Last A1c 6.5  -blood sugars now significantly elevated - adjust tx and follow trend  HTN -improved  -continue to hold ACE inhibitor and Lasix for now  CAD (coronary artery disease) -Stable  Thrombocytopenia, unspecified -likely due to DVT - now recovered  Anemia -hgb stable  ?Seizure disorder -Recently started on Tegretol for suspected focal seizures - has been on hold due to lethargy -EEG without evidence of seizure so likely will not require resumption of Tegretol   DVT prophylaxis: Lovenox Code Status: Full Family Communication: No family at bedside upon evaluation Disposition Plan/Expected LOS: transfer to med/surg- will likely need SNF- PT/OT evaluation pending  Consultants: None  Procedures: EEG c/w encephalopathy and not seizures  Left Lower extremity venous Doppler Findings consistent with acute deep vein thrombosis involving the common femoral, femoral, popliteal, and posterior tibial veins of the left lower extremity. There is no propagation noted to the right lower extremity.  Antibiotics: Zosyn 7/29 >> 8/1 + 8/4 >>  HPI/Subjective: Patient awakens but seems very lethargic today.  Unable to provide a reliable hx.  Objective: Blood pressure 140/78, pulse 97, temperature 98.4 F (36.9 C), temperature source Oral, resp. rate 20, height 5\' 9"  (1.753 m), weight 86.7 kg (  191 lb 2.2 oz), SpO2 98.00%.  Intake/Output Summary (Last 24 hours) at 10/04/12 1447 Last data filed at  10/04/12 0900  Gross per 24 hour  Intake    180 ml  Output   1501 ml  Net  -1321 ml    Exam: General: No acute respiratory distress Lungs: Clear to auscultation bilaterally without wheezes or crackles, RA Cardiovascular: Regular rate and rhythm without murmur gallop or rub normal S1 and S2, unilateral peripheral edema left leg without JVD Abdomen: Nontender, nondistended, soft, bowel sounds positive, no rebound, no ascites, no appreciable mass Musculoskeletal: No significant cyanosis, clubbing of bilateral lower extremities Neurological: Awakens - will follow simple commands - slow to respond - is not oriented    Scheduled Meds: Scheduled Meds: . enoxaparin (LOVENOX) injection  80 mg Subcutaneous Q12H  . feeding supplement  1 Container Oral TID BM  . insulin aspart  0-15 Units Subcutaneous TID WC  . insulin aspart  0-5 Units Subcutaneous QHS  . potassium chloride  40 mEq Oral TID  . sodium chloride  3 mL Intravenous Q12H    Data Reviewed: Basic Metabolic Panel:  Recent Labs Lab 09/28/12 1744  09/29/12 1010 09/30/12 0438 10/01/12 0516 10/01/12 1814 10/01/12 1956 10/02/12 0530 10/03/12 0435  NA 163*  < > 161* 157* 152*  --   --  144 142  K 3.4*  < > 3.3* 3.2* 2.6*  --  2.8* 2.5* 2.6*  CL 128*  < > 130* 126* 119*  --   --  110 108  CO2 20  < > 22 21 21   --   --  20 24  GLUCOSE 158*  < > 179* 221* 202*  --   --  224* 185*  BUN 34*  < > 41* 35* 27*  --   --  20 14  CREATININE 1.36*  < > 1.88* 1.92* 1.79*  --   --  1.58* 1.29  CALCIUM 9.3  < > 8.7 8.8 8.7  --   --  8.2* 8.6  MG 2.1  --   --   --   --  2.0  --   --  1.9  PHOS 2.7  --   --   --   --   --   --   --   --   < > = values in this interval not displayed. Liver Function Tests:  Recent Labs Lab 09/28/12 1744 10/01/12 0516  AST 29 38*  ALT 28 32  ALKPHOS 291* 301*  BILITOT 3.6* 2.0*  PROT 6.8 6.3  ALBUMIN 2.3* 1.8*    Recent Labs Lab 09/28/12 2121  AMMONIA 24   CBC:  Recent Labs Lab  09/28/12 1744  09/30/12 0438 10/01/12 0516 10/01/12 1814 10/02/12 0908 10/04/12 0510  WBC 17.1*  < > 20.0* 17.5* 14.4* 11.5* 10.4  NEUTROABS 13.7*  --   --  13.8*  --   --   --   HGB 11.9*  < > 10.3* 9.9* 9.6* 9.3* 10.7*  HCT 37.3*  < > 31.0* 28.8* 27.8* 27.4* 30.9*  MCV 104.8*  < > 103.0* 100.0 98.6 98.6 97.2  PLT 146*  < > 133* 156 154 167 258  < > = values in this interval not displayed. Cardiac Enzymes:  Recent Labs Lab 09/28/12 2122  TROPONINI <0.30   CBG:  Recent Labs Lab 10/03/12 1129 10/03/12 1630 10/03/12 2041 10/04/12 0731 10/04/12 1142  GLUCAP 243* 227* 211* 203* 222*    Recent Results (  from the past 240 hour(s))  MRSA PCR SCREENING     Status: None   Collection Time    09/28/12  3:51 PM      Result Value Range Status   MRSA by PCR NEGATIVE  NEGATIVE Final   Comment:            The GeneXpert MRSA Assay (FDA     approved for NASAL specimens     only), is one component of a     comprehensive MRSA colonization     surveillance program. It is not     intended to diagnose MRSA     infection nor to guide or     monitor treatment for     MRSA infections.  URINE CULTURE     Status: None   Collection Time    09/28/12  6:18 PM      Result Value Range Status   Specimen Description URINE, CLEAN CATCH   Final   Special Requests NONE   Final   Culture  Setup Time 09/28/2012 21:39   Final   Colony Count NO GROWTH   Final   Culture NO GROWTH   Final   Report Status 09/29/2012 FINAL   Final  CULTURE, BLOOD (ROUTINE X 2)     Status: None   Collection Time    09/28/12  9:15 PM      Result Value Range Status   Specimen Description BLOOD ARM LEFT   Final   Special Requests BOTTLES DRAWN AEROBIC AND ANAEROBIC 10CC   Final   Culture  Setup Time 09/29/2012 03:33   Final   Culture     Final   Value:        BLOOD CULTURE RECEIVED NO GROWTH TO DATE CULTURE WILL BE HELD FOR 5 DAYS BEFORE ISSUING A FINAL NEGATIVE REPORT   Report Status PENDING   Incomplete  CULTURE,  BLOOD (ROUTINE X 2)     Status: None   Collection Time    09/28/12  9:25 PM      Result Value Range Status   Specimen Description BLOOD HAND RIGHT   Final   Special Requests BOTTLES DRAWN AEROBIC ONLY 10CC   Final   Culture  Setup Time 09/29/2012 03:33   Final   Culture     Final   Value:        BLOOD CULTURE RECEIVED NO GROWTH TO DATE CULTURE WILL BE HELD FOR 5 DAYS BEFORE ISSUING A FINAL NEGATIVE REPORT   Report Status PENDING   Incomplete  URINE CULTURE     Status: None   Collection Time    09/30/12 10:28 AM      Result Value Range Status   Specimen Description URINE, RANDOM   Final   Special Requests NONE   Final   Culture  Setup Time 09/30/2012 11:35   Final   Colony Count NO GROWTH   Final   Culture NO GROWTH   Final   Report Status 10/01/2012 FINAL   Final     Studies:  Recent x-ray studies have been reviewed in detail by the Attending Physician   ELLIS,ALLISON L.,MD [phone] 10/04/2012, 2:47 PM  I have personally examined this patient and reviewed the entire database. I have reviewed the above note, made any necessary editorial changes, and agree with its content.  Lonia Blood, MD [facility]

## 2012-10-04 NOTE — Progress Notes (Deleted)
Inpatient Diabetes Program Recommendations  AACE/ADA: New Consensus Statement on Inpatient Glycemic Control (2013)  Target Ranges:  Prepandial:   less than 140 mg/dL      Peak postprandial:   less than 180 mg/dL (1-2 hours)      Critically ill patients:  140 - 180 mg/dL   Reason for Assessment: Persistent Hyperglycemia  Results for TWAN, HARKIN (MRN [id number]) as of 10/04/2012 14:09  Ref. Range 10/03/2012 07:50 10/03/2012 11:29 10/03/2012 16:30 10/03/2012 20:41 10/04/2012 07:31 10/04/2012 11:42  Glucose-Capillary Latest Range: 70-99 mg/dL 696 (H) 295 (H) 284 (H) 211 (H) 203 (H) 222 (H)   Note:  Please consider adding Lantus 20 units to glycemic control regimen.  Thank you.  Dearl Rudden S. Elsie Lincoln, RN, CNS, CDE Inpatient Diabetes Program, team pager [phone]

## 2012-10-04 NOTE — Progress Notes (Signed)
Called MD about patient's apnea while sleeping.  MD wrote order to keep oxygen on at all times while sleeping.  Oxygen placed on patient as he is currently sleeping. Will continue to monitor patient.

## 2012-10-04 NOTE — Progress Notes (Signed)
Speech Language Pathology Dysphagia Treatment Patient Details Name: Shaun Whitney MRN: [id number] DOB: 1930/06/23 Today's Date: 10/04/2012 Time: 7829-5621 SLP Time Calculation (min): 11 min  Assessment / Plan / Recommendation Clinical Impression  F/u for dysphagia.  Pt with minimal change in swallow function.  Presents with persisting oral delays and impaired initiation and persistence through swallowing sequence.  Requires mod multimodal cues given deficits in cognition.  Pt is tolerating current dysphagia 1 diet with nectar-thick liquids; trials of thin liquids accompanied by wet phonation.  Lungs diminished but clear.  Recommend continuing current diet - potential to advance may be poor.      Diet Recommendation  Continue with Current Diet: Dysphagia 1 (puree);Nectar-thick liquid    SLP Plan Continue with current plan of care   Pertinent Vitals/Pain Grimacing with bed repositioning; improved with HOB elevation.   Swallowing Goals  SLP Swallowing Goals Patient will utilize recommended strategies during swallow to increase swallowing safety with: Moderate cueing Swallow Study Goal #2 - Progress: Progressing toward goal  General Temperature Spikes Noted: No Respiratory Status: Room air Behavior/Cognition: Alert;Cooperative;Requires cueing Oral Cavity - Dentition: Poor condition Patient Positioning: Upright in bed  Oral Cavity - Oral Hygiene Does patient have any of the following "at risk" factors?: Other - dysphagia;Tongue - coated;Saliva - thick, dry mouth Patient is HIGH RISK - Oral Care Protocol followed (see row info): Yes   Dysphagia Treatment Treatment focused on: Skilled observation of diet tolerance (no family present) Treatment Methods/Modalities: Skilled observation Patient observed directly with PO's: Yes Type of PO's observed: Dysphagia 2 (chopped);Nectar-thick liquids;Thin liquids Feeding: Needs assist Liquids provided via: Cup Oral Phase Signs & Symptoms: Prolonged  bolus formation;Prolonged oral phase Pharyngeal Phase Signs & Symptoms: Suspected delayed swallow initiation;Multiple swallows Type of cueing: Verbal;Tactile;Visual Amount of cueing: Moderate   GO     Shaun Whitney Shaun Whitney 10/04/2012, 2:34 PM

## 2012-10-04 NOTE — Consult Note (Signed)
PHARMACY CONSULT NOTE - INITIAL  Pharmacy Consult for :   Vancomycin, [Zosyn restarted] Indication:  Empiric Vancomycin [and Zosyn] for presumed Nosocomial PNA v/s UTI in patient with fever, leukocytosis  Hospital Problems: Active Problems:   HTN (hypertension)   Chronic combined systolic and diastolic congestive heart failure   CAD (coronary artery disease)   Acute encephalopathy   Hypokalemia   Dehydration with hypernatremia   Fever   CKD (chronic kidney disease) stage 3, GFR 30-59 ml/min   Acute renal failure   Thrombocytopenia, unspecified   Anemia   UTI (urinary tract infection)   DVT (deep venous thrombosis)   Allergies: No Known Allergies  Patient Measurements: Height: 5\' 9"  (175.3 cm) Weight: 191 lb 2.2 oz (86.7 kg) IBW/kg (Calculated) : 70.7  Vital Signs: 102.7 F (39.3 C) (Rectal)   Labs:  Recent Labs  10/01/12 1814 10/02/12 0530 10/02/12 0908 10/03/12 0435 10/04/12 0510  WBC 14.4*  --  11.5*  --  10.4  HGB 9.6*  --  9.3*  --  10.7*  PLT 154  --  167  --  258  CREATININE  --  1.58*  --  1.29  --    Estimated Creatinine Clearance: 49.8 ml/min (by C-G formula based on Cr of 1.29).   Microbiology: Recent Results (from the past 720 hour(s))  MRSA PCR SCREENING     Status: None   Collection Time    09/28/12  3:51 PM      Result Value Range Status   MRSA by PCR NEGATIVE  NEGATIVE Final   Comment:            The GeneXpert MRSA Assay (FDA     approved for NASAL specimens     only), is one component of a     comprehensive MRSA colonization     surveillance program. It is not     intended to diagnose MRSA     infection nor to guide or     monitor treatment for     MRSA infections.  URINE CULTURE     Status: None   Collection Time    09/28/12  6:18 PM      Result Value Range Status   Specimen Description URINE, CLEAN CATCH   Final   Special Requests NONE   Final   Culture  Setup Time 09/28/2012 21:39   Final   Colony Count NO GROWTH   Final    Culture NO GROWTH   Final   Report Status 09/29/2012 FINAL   Final  CULTURE, BLOOD (ROUTINE X 2)     Status: None   Collection Time    09/28/12  9:15 PM      Result Value Range Status   Specimen Description BLOOD ARM LEFT   Final   Special Requests BOTTLES DRAWN AEROBIC AND ANAEROBIC 10CC   Final   Culture  Setup Time 09/29/2012 03:33   Final   Culture     Final   Value:        BLOOD CULTURE RECEIVED NO GROWTH TO DATE CULTURE WILL BE HELD FOR 5 DAYS BEFORE ISSUING A FINAL NEGATIVE REPORT   Report Status PENDING   Incomplete  CULTURE, BLOOD (ROUTINE X 2)     Status: None   Collection Time    09/28/12  9:25 PM      Result Value Range Status   Specimen Description BLOOD HAND RIGHT   Final   Special Requests BOTTLES DRAWN AEROBIC ONLY 10CC  Final   Culture  Setup Time 09/29/2012 03:33   Final   Culture     Final   Value:        BLOOD CULTURE RECEIVED NO GROWTH TO DATE CULTURE WILL BE HELD FOR 5 DAYS BEFORE ISSUING A FINAL NEGATIVE REPORT   Report Status PENDING   Incomplete  URINE CULTURE     Status: None   Collection Time    09/30/12 10:28 AM      Result Value Range Status   Specimen Description URINE, RANDOM   Final   Special Requests NONE   Final   Culture  Setup Time 09/30/2012 11:35   Final   Colony Count NO GROWTH   Final   Culture NO GROWTH   Final   Report Status 10/01/2012 FINAL   Final    Medical/Surgical History: Past Medical History  Diagnosis Date  . Aortic stenosis   . Diabetes mellitus type II   . Hypertension   . Gout   . GERD (gastroesophageal reflux disease)    Past Surgical History  Procedure Laterality Date  . Joint replacement      right knee  . Tee without cardioversion  01/15/2012    Procedure: TRANSESOPHAGEAL ECHOCARDIOGRAM (TEE);  Surgeon: Laurey Morale, MD;  Location[facility];  Service: Cardiovascular;  Laterality: N/A;    Medications:  Anti-infectives   Start     Dose/Rate Route Frequency Ordered Stop   10/04/12 1800   piperacillin-tazobactam (ZOSYN) IVPB 3.375 g     3.375 g 12.5 mL/hr over 240 Minutes Intravenous Every 8 hours 10/04/12 1715     09/28/12 2200  piperacillin-tazobactam (ZOSYN) IVPB 3.375 g  Status:  Discontinued     3.375 g 12.5 mL/hr over 240 Minutes Intravenous 3 times per day 09/28/12 1957 10/01/12 1510      Assessment:  79 y/o male who will receive empiric Vancomycin and Zosyn for presumed Nosocomial PNA v/s UTI in patient with fever and leukocytosis  CrCl 50 ml/min, last WBC 10.4, Tmax 102.7 F  Goal of Therapy:   Vancomycin trough level 15-20 mcg/ml Antibiotics selected for infection/cultures and adjusted for renal function.   Plan:   Begin Vancomycin 750 mg IV q 12 hours. Continue Zosyn 3.375 gm IV q 8 hours, each dose to infuse over 4 hours as ordered.  Follow up SCr, UOP, cultures, clinical course and adjust as clinically indicated.   Paydon Carll, Elisha Headland,  Pharm.D.,    8/4/20145:47 PM

## 2012-10-04 NOTE — Significant Event (Addendum)
Rapid Response Event Note  Overview: Time Called: 1655 Arrival Time: 1700 Event Type: Neurologic  Initial Focused Assessment: Pt awake, lethargic and slow to respond to commands. eyes open, followed command to show 2 fingers on right hand.  Would not stick out tongue on command. Nonverbal, tongue noted to be quivering.  Hot to touch.  Rectal temp 102.7. 128/64 manual, 117 and regular, 26 resp. On 2l [place].  BIlat BS clear, diminished in bases.  ABG obtained per DR McCLung   Ph 7.47 PCO2 34.6 PO2 70.8 Bicarb 24.8 Sat 93 % .     Interventions: Tylenol 650 mg supp given,  Blood cultures obtained per order, Urine cultures obtained. IVF resumed per order. New IV x 2  inserted in left AC and right forearm.  Spoke with Dr [employer] Clung at [phone]. placed on Cardiac monitor .Cardiac tele notified of placement at 1845.  Will place on continuous pulse ox.  IV antibiotics resumed.  Bedside suction set up, oral care given. Positive gag, fair cough effort.      Hand off report given to [facility], Charity fundraiser.   RRT will follow    Event Summary: Stabilized and stayed in room.   at  Dr Sharon Seller notified PTA    at          [place], [place]

## 2012-10-04 NOTE — Progress Notes (Signed)
NUTRITION FOLLOW UP  Intervention:   Ensure Pudding po TID, each supplement provides 170 kcal and 4 grams of protein.    Nutrition Dx:   Malnutrition related to inadequate oral intake as evidenced by 12% weight loss in the past 3 months and severe depletion of muscle mass.   Goal:   Intake to meet >/=90% estimated nutrition needs. Unmet  Monitor:   PO intake, weight trends, labs   Assessment:   Pt admitted to [facility] from [facility] hospital with ongoing encephalopathy, likely related to hypernatremia and DVT. Seen by SLP and diet was advanced to Dysphagia 1, nectar thick liquids. Pt intake has been variable. 20-80% of meals.  Per notes, pt is more alert and oriented. Pt did not respond to any questions for this RD, eyes open but question if pt was sleeping?  Pt with increased needs to promote wound healing, and expect pt unable to meet nutrition needs on current diet/level of intake, RD will add oral nutrition supplements.   Height: Ht Readings from Last 1 Encounters:  10/01/12 5\' 9"  (1.753 m)    Weight Status:   Wt Readings from Last 1 Encounters:  10/03/12 191 lb 2.2 oz (86.7 kg)  Weight trending up from admission, likely related to fluid status.   Re-estimated needs:  Kcal: 1900-2100 Protein: 115-130 gm  Fluid: 2-2.2 L   Skin: Stage 3 pressure ulcer on sacrum   Diet Order: Dysphagia 1, nectar thick liquids    Intake/Output Summary (Last 24 hours) at 10/04/12 1027 Last data filed at 10/04/12 0900  Gross per 24 hour  Intake    180 ml  Output   1501 ml  Net  -1321 ml    Last BM: none being documented    Labs:   Recent Labs Lab 09/28/12 1744  10/01/12 0516 10/01/12 1814 10/01/12 1956 10/02/12 0530 10/03/12 0435  NA 163*  < > 152*  --   --  144 142  K 3.4*  < > 2.6*  --  2.8* 2.5* 2.6*  CL 128*  < > 119*  --   --  110 108  CO2 20  < > 21  --   --  20 24  BUN 34*  < > 27*  --   --  20 14  CREATININE 1.36*  < > 1.79*  --   --  1.58* 1.29  CALCIUM 9.3  < >  8.7  --   --  8.2* 8.6  MG 2.1  --   --  2.0  --   --  1.9  PHOS 2.7  --   --   --   --   --   --   GLUCOSE 158*  < > 202*  --   --  224* 185*  < > = values in this interval not displayed.  CBG (last 3)   Recent Labs  10/03/12 1630 10/03/12 2041 10/04/12 0731  GLUCAP 227* 211* 203*    Scheduled Meds: . enoxaparin (LOVENOX) injection  80 mg Subcutaneous Q12H  . insulin aspart  0-15 Units Subcutaneous TID WC  . insulin aspart  0-5 Units Subcutaneous QHS  . potassium chloride  40 mEq Oral TID  . sodium chloride  3 mL Intravenous Q12H    Continuous Infusions:  none at this time   Clarene Duke RD, LDN Pager [phone] After Hours pager [phone]

## 2012-10-04 NOTE — Progress Notes (Signed)
Resp Therapy called and informed of need for stat ABG.

## 2012-10-04 NOTE — Progress Notes (Signed)
Inpatient Diabetes Program Recommendations  AACE/ADA: New Consensus Statement on Inpatient Glycemic Control (2013)  Target Ranges:  Prepandial:   less than 140 mg/dL      Peak postprandial:   less than 180 mg/dL (1-2 hours)      Critically ill patients:  140 - 180 mg/dL   Reason for Assessment: Persistent Hyperglycemia  Results for CARLES, FLOREA (MRN [id number]) as of 10/04/2012 14:09  Ref. Range 10/03/2012 07:50 10/03/2012 11:29 10/03/2012 16:30 10/03/2012 20:41 10/04/2012 07:31 10/04/2012 11:42  Glucose-Capillary Latest Range: 70-99 mg/dL 147 (H) 829 (H) 562 (H) 211 (H) 203 (H) 222 (H)   Note: Please consider adding Lantus 10 units to glycemic control regimen.  Thank you.  Marieclaire Bettenhausen S. Elsie Lincoln, RN, CNS, CDE Inpatient Diabetes Program, team pager [phone]

## 2012-10-05 ENCOUNTER — Inpatient Hospital Stay (HOSPITAL_COMMUNITY): Payer: Medicare Other

## 2012-10-05 DIAGNOSIS — R509 Fever, unspecified: Secondary | ICD-10-CM

## 2012-10-05 LAB — CBC
MCH: 33.8 pg (ref 26.0–34.0)
MCHC: 34.3 g/dL (ref 30.0–36.0)
MCV: 98.6 fL (ref 78.0–100.0)
Platelets: 261 10*3/uL (ref 150–400)
RDW: 14 % (ref 11.5–15.5)

## 2012-10-05 LAB — CULTURE, BLOOD (ROUTINE X 2): Culture: NO GROWTH

## 2012-10-05 LAB — BASIC METABOLIC PANEL
CO2: 24 mEq/L (ref 19–32)
Calcium: 8.5 mg/dL (ref 8.4–10.5)
Creatinine, Ser: 1.42 mg/dL — ABNORMAL HIGH (ref 0.50–1.35)
GFR calc non Af Amer: 45 mL/min — ABNORMAL LOW (ref >90)
Glucose, Bld: 229 mg/dL — ABNORMAL HIGH (ref 70–99)

## 2012-10-05 LAB — GLUCOSE, CAPILLARY
Glucose-Capillary: 182 mg/dL — ABNORMAL HIGH (ref 70–99)
Glucose-Capillary: 198 mg/dL — ABNORMAL HIGH (ref 70–99)
Glucose-Capillary: 221 mg/dL — ABNORMAL HIGH (ref 70–99)
Glucose-Capillary: 234 mg/dL — ABNORMAL HIGH (ref 70–99)

## 2012-10-05 NOTE — Progress Notes (Signed)
Physical Therapy Treatment Patient Details Name: Shaun Whitney MRN: [id number] DOB: 1931-01-27 Today's Date: 10/05/2012 Time: 4098-1191 PT Time Calculation (min): 26 min  PT Assessment / Plan / Recommendation  History of Present Illness Pt admit with acute encephalopathy.   PT Comments   Pt agreeable to therapy and verbalizes wanting to sit, however pt with very minimal ability to A with mobility.    Follow Up Recommendations  SNF;Supervision/Assistance - 24 hour     Does the patient have the potential to tolerate intense rehabilitation     Barriers to Discharge        Equipment Recommendations  None recommended by PT    Recommendations for Other Services    Frequency Min 2X/week   Progress towards PT Goals Progress towards PT goals: Progressing toward goals  Plan Current plan remains appropriate    Precautions / Restrictions Precautions Precautions: Fall Restrictions Weight Bearing Restrictions: No   Pertinent Vitals/Pain Grimaces and verbalizes "ooooww" during bed mobility and with movement of L UE.  Does not rate.      Mobility  Bed Mobility Bed Mobility: Right Sidelying to Sit;Sitting - Scoot to Edge of Bed;Sit to Supine Right Sidelying to Sit: 1: +2 Total assist Right Sidelying to Sit: Patient Percentage: 0% Sitting - Scoot to Edge of Bed: 1: +1 Total assist Sit to Supine: 1: +2 Total assist Sit to Supine: Patient Percentage: 10% Details for Bed Mobility Assistance: pt with minimal movement and participation during mobility.  No active movement noted in bil LEs.   Transfers Transfers: Not assessed Ambulation/Gait Ambulation/Gait Assistance: Not tested (comment) Stairs: No Wheelchair Mobility Wheelchair Mobility: No    Exercises     PT Diagnosis:    PT Problem List:   PT Treatment Interventions:     PT Goals (current goals can now be found in the care plan section) Acute Rehab PT Goals Time For Goal Achievement: 10/13/12 Potential to Achieve Goals:  Fair  Visit Information  Last PT Received On: 10/05/12 Assistance Needed: +2 PT/OT Co-Evaluation/Treatment: Yes History of Present Illness: Pt admit with acute encephalopathy.    Subjective Data  Subjective: Minimal Verbalizations.     Cognition  Cognition Arousal/Alertness: Awake/alert Behavior During Therapy: Flat affect Overall Cognitive Status: No family/caregiver present to determine baseline cognitive functioning    Balance  Balance Balance Assessed: Yes Static Sitting Balance Static Sitting - Balance Support: Bilateral upper extremity supported;Feet supported Static Sitting - Level of Assistance: 3: Mod assist;2: Max assist Static Sitting - Comment/# of Minutes: pt mostly MaxA to maintain sitting balance, with cueing pt able to be ModA for very brief moment.  pt sat EOB a total of .    End of Session PT - End of Session Activity Tolerance: Patient limited by fatigue Patient left: in bed;with call bell/phone within reach;with bed alarm set Nurse Communication: Mobility status;Need for lift equipment   GP     Sunny Schlein, [place] [phone] 10/05/2012, 11:54 AM

## 2012-10-05 NOTE — Progress Notes (Signed)
Crushed meds with pudding, pocketing food on right side,.  Constant reminding to swallow. Patient ate only bites of sure.  i         P

## 2012-10-05 NOTE — Progress Notes (Signed)
[facility] PROGRESS NOTE  Shaun Whitney ZOX:[id number] DOB: 10/30/1930 DOA: 09/28/2012 PCP: Kirstie Peri, MD  Brief narrative 79 year old male who was  recently hospitalized at [facility] on 09/09/2012 for confusion and generalized weakness.During that admission,  it was felt the patient may have been having focal seizures and he was started on Tegretol .  He was discharged to a SNF. Two  weeks later patient developed increasing lethargy and poor oral intake. He was readmitted to [facility] on 09/26/2012. 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 He was found to be in acute renal failure with hypernatremia and was hydrated. He remained confused and encephalopathic. Family spoke with the patient's primary care physician and requested the patient be transferred to [facility].   Assessment/Plan:  Acute toxic metabolic encephalopathy  Possibly due to hypernatremia and fever with UTI -prior MRI reported as negative  -EEG suggests encephalopathy, no evidence of seizure activity or foci  Fever/leukocytosis  -positive UA positive but urine culture negative  abx discontinued ( zosyn) but was resumed on 8/4 along with vanco  as patient became febrile again to 102.84F. Blood cx negative. CXR ordered  Dehydration with hypernatremia  -resolved with IV fluids  -was on Lasix prior to admission   LLE edema/acute DVT  -venous duplex shows extensive DVT LLE  --on full dose Lovenox since  7/31  -switch to Xarelto when taking po well  Acute renal failure on CKD (chronic kidney disease) stage 3, GFR 30-59 ml/min  -baseline Scr 1.18 with GFR around 60  -likely due to dehydration - granular cast in UA suggest ATN as well  -[place] normal  -mildly deranged creatinine tin today's lab. monitor closely   Chronic diastolic congestive heart failure  -Follows with [facility]  -euvolemic at present  Hypokalemia  -relpenished   Stage 2 sacral decubitus  -appreciate  Wound  RN eval  - Mild Dysphagia  -SLP eval rec D1 diet w/ nectar liquids   Hyperglycemia  -Last A1c 6.5  -fsg stable  HTN  -improved  -continue to hold ACE inhibitor and Lasix for now   CAD (coronary artery disease)  -Stable   Thrombocytopenia, unspecified  resolved  Anemia  -hgb stable   ?Seizure disorder  -Recently started on Tegretol for suspected focal seizures - has been on hold due to lethargy  -EEG without evidence of seizure   DVT prophylaxis: full dose Lovenox   Code Status: Full  Family Communication: No family at bedside upon evaluation  Disposition Plan SNF once improved  Consultants:  None   Procedures:   EEG LLE doppler  Antibiotics:  Zosyn 7/29 >> 8/1   8/4 >>  Vancomycin 8/4>>  HPI/Subjective:  Patient lethargic and disoriented. RRT called last evening as patient was not responding well. Noted for fever as well.      Objective: Filed Vitals:   10/05/12 1330  BP: 104/55  Pulse: 80  Temp: 98.4 F (36.9 C)  Resp: 20    Intake/Output Summary (Last 24 hours) at 10/05/12 1528 Last data filed at 10/05/12 0800  Gross per 24 hour  Intake    815 ml  Output    300 ml  Net    515 ml   Filed Weights   10/02/12 2216 10/03/12 2038 10/04/12 2151  Weight: 87.2 kg (192 lb 3.9 oz) 86.7 kg (191 lb 2.2 oz) 84.9 kg (187 lb 2.7 oz)    Exam:  General: elderly frail male in NAD HEENT: no pallor,  moist mucosa CHEST: Clear to auscultation bilaterally  Cardiovascular: Regular rate and rhythm No murmur gallop or rub normal  Abdomen: soft, Nontender, nondistended, soft, bowel sounds positive, Musculoskeletal: warm, no edema Neurological: alert and follows simple commands only. Not oriented   Data Reviewed: Basic Metabolic Panel:  Recent Labs Lab 09/28/12 1744  09/30/12 0438 10/01/12 0516 10/01/12 1814 10/01/12 1956 10/02/12 0530 10/03/12 0435 10/05/12 0500  NA 163*  < > 157* 152*  --   --  144 142 144  K 3.4*  < > 3.2* 2.6*  --   2.8* 2.5* 2.6* 3.9  CL 128*  < > 126* 119*  --   --  110 108 110  CO2 20  < > 21 21  --   --  20 24 24   GLUCOSE 158*  < > 221* 202*  --   --  224* 185* 229*  BUN 34*  < > 35* 27*  --   --  20 14 20   CREATININE 1.36*  < > 1.92* 1.79*  --   --  1.58* 1.29 1.42*  CALCIUM 9.3  < > 8.8 8.7  --   --  8.2* 8.6 8.5  MG 2.1  --   --   --  2.0  --   --  1.9  --   PHOS 2.7  --   --   --   --   --   --   --   --   < > = values in this interval not displayed. Liver Function Tests:  Recent Labs Lab 09/28/12 1744 10/01/12 0516  AST 29 38*  ALT 28 32  ALKPHOS 291* 301*  BILITOT 3.6* 2.0*  PROT 6.8 6.3  ALBUMIN 2.3* 1.8*   No results found for this basename: LIPASE, AMYLASE,  in the last 168 hours  Recent Labs Lab 09/28/12 2121  AMMONIA 24   CBC:  Recent Labs Lab 09/28/12 1744  10/01/12 0516 10/01/12 1814 10/02/12 0908 10/04/12 0510 10/05/12 0500  WBC 17.1*  < > 17.5* 14.4* 11.5* 10.4 10.5  NEUTROABS 13.7*  --  13.8*  --   --   --   --   HGB 11.9*  < > 9.9* 9.6* 9.3* 10.7* 9.4*  HCT 37.3*  < > 28.8* 27.8* 27.4* 30.9* 27.4*  MCV 104.8*  < > 100.0 98.6 98.6 97.2 98.6  PLT 146*  < > 156 154 167 258 261  < > = values in this interval not displayed. Cardiac Enzymes:  Recent Labs Lab 09/28/12 2122  TROPONINI <0.30   BNP (last 3 results) No results found for this basename: PROBNP,  in the last 8760 hours CBG:  Recent Labs Lab 10/04/12 1659 10/04/12 1844 10/04/12 2154 10/05/12 0832 10/05/12 1141  GLUCAP 257* 222* 182* 198* 221*    Recent Results (from the past 240 hour(s))  MRSA PCR SCREENING     Status: None   Collection Time    09/28/12  3:51 PM      Result Value Range Status   MRSA by PCR NEGATIVE  NEGATIVE Final   Comment:            The GeneXpert MRSA Assay (FDA     approved for NASAL specimens     only), is one component of a     comprehensive MRSA colonization     surveillance program. It is not     intended to diagnose MRSA     infection nor to guide or  monitor treatment for     MRSA infections.  URINE CULTURE     Status: None   Collection Time    09/28/12  6:18 PM      Result Value Range Status   Specimen Description URINE, CLEAN CATCH   Final   Special Requests NONE   Final   Culture  Setup Time 09/28/2012 21:39   Final   Colony Count NO GROWTH   Final   Culture NO GROWTH   Final   Report Status 09/29/2012 FINAL   Final  CULTURE, BLOOD (ROUTINE X 2)     Status: None   Collection Time    09/28/12  9:15 PM      Result Value Range Status   Specimen Description BLOOD ARM LEFT   Final   Special Requests BOTTLES DRAWN AEROBIC AND ANAEROBIC 10CC   Final   Culture  Setup Time     Final   Value: 09/29/2012 03:33     Performed at [facility]   Culture     Final   Value: NO GROWTH 5 DAYS     Performed at [facility]   Report Status 10/05/2012 FINAL   Final  CULTURE, BLOOD (ROUTINE X 2)     Status: None   Collection Time    09/28/12  9:25 PM      Result Value Range Status   Specimen Description BLOOD HAND RIGHT   Final   Special Requests BOTTLES DRAWN AEROBIC ONLY 10CC   Final   Culture  Setup Time     Final   Value: 09/29/2012 03:33     Performed at [facility]   Culture     Final   Value: NO GROWTH 5 DAYS     Performed at [facility]   Report Status 10/05/2012 FINAL   Final  URINE CULTURE     Status: None   Collection Time    09/30/12 10:28 AM      Result Value Range Status   Specimen Description URINE, RANDOM   Final   Special Requests NONE   Final   Culture  Setup Time 09/30/2012 11:35   Final   Colony Count NO GROWTH   Final   Culture NO GROWTH   Final   Report Status 10/01/2012 FINAL   Final  CULTURE, BLOOD (ROUTINE X 2)     Status: None   Collection Time    10/04/12  5:50 PM      Result Value Range Status   Specimen Description BLOOD RIGHT ARM   Final   Special Requests BOTTLES DRAWN AEROBIC AND ANAEROBIC 10CC   Final   Culture  Setup Time     Final   Value: 10/04/2012  22:52     Performed at [facility]   Culture     Final   Value:        BLOOD CULTURE RECEIVED NO GROWTH TO DATE CULTURE WILL BE HELD FOR 5 DAYS BEFORE ISSUING A FINAL NEGATIVE REPORT     Performed at [facility]   Report Status PENDING   Incomplete  CULTURE, BLOOD (ROUTINE X 2)     Status: None   Collection Time    10/04/12  5:57 PM      Result Value Range Status   Specimen Description BLOOD RIGHT ARM   Final   Special Requests BOTTLES DRAWN AEROBIC AND ANAEROBIC 10CC   Final   Culture  Setup Time  Final   Value: 10/04/2012 22:53     Performed at [facility]   Culture     Final   Value:        BLOOD CULTURE RECEIVED NO GROWTH TO DATE CULTURE WILL BE HELD FOR 5 DAYS BEFORE ISSUING A FINAL NEGATIVE REPORT     Performed at [facility]   Report Status PENDING   Incomplete     Studies: No results found.  Scheduled Meds: . carvedilol  12.5 mg Oral BID  . enoxaparin (LOVENOX) injection  80 mg Subcutaneous Q12H  . feeding supplement  1 Container Oral TID BM  . insulin aspart  0-15 Units Subcutaneous TID WC  . insulin aspart  0-5 Units Subcutaneous QHS  . insulin detemir  10 Units Subcutaneous QHS  . piperacillin-tazobactam (ZOSYN)  IV  3.375 g Intravenous Q8H  . potassium chloride  40 mEq Oral TID  . sodium chloride  3 mL Intravenous Q12H  . vancomycin (VANCOCIN) 750 mg IVPB  750 mg Intravenous Q12H   Continuous Infusions: . 0.9 % NaCl with KCl 20 mEq / L 50 mL/hr at 10/04/12 1830      Time spent: 25 minutes    Rayford Williamsen  [facility] Pager [phone] If 7PM-7AM, please contact night-coverage at [email or web address], password [facility] 10/05/2012, 3:28 PM  LOS: 7 days

## 2012-10-05 NOTE — Progress Notes (Signed)
Occupational Therapy Treatment Patient Details Name: MITCHEL DELDUCA MRN: [id number] DOB: 09-16-1930 Today's Date: 10/05/2012 Time: 6578-4696 OT Time Calculation (min): 29 min  OT Assessment / Plan / Recommendation  History of present illness Pt admit with acute encephalopathy.   OT comments  Pt agreeable to sit at EOB today in prep for increased participation in ADL's. Pt demonstrates decreased activity tolerance, endurance/participation. Note: pt was Rapid Response last evening (10/04/12).   Follow Up Recommendations  SNF    Barriers to Discharge       Equipment Recommendations  3 in 1 bedside comode    Recommendations for Other Services    Frequency Min 2X/week   Progress towards OT Goals Progress towards OT goals:  (Note pt was Rapid Response last evening)  Plan Discharge plan remains appropriate    Precautions / Restrictions Precautions Precautions: Fall Restrictions Weight Bearing Restrictions: No   Pertinent Vitals/Pain Grimaces and verbalizes "ooooww" during bed mobility and with movement of L UE. Does not rate.      ADL  Grooming: Performed;Wash/dry face;Supervision/safety;Min A Where Assessed - Grooming: Supported sitting ADL Comments: Session focused on EOB sitting balance, endurance & activity tolerance. Overall, pt required total assist +1-2 sitting EOB & unable to reach with R or L UE or tap toes/feet.    OT Diagnosis:    OT Problem List:   OT Treatment Interventions:     OT Goals(current goals can now be found in the care plan section) Acute Rehab OT Goals Patient Stated Goal: unable to state OT Goal Formulation: With patient Time For Goal Achievement: 10/13/12 Potential to Achieve Goals: Fair  Visit Information  Last OT Received On: 10/05/12 Assistance Needed: +2 PT/OT Co-Evaluation/Treatment: Yes History of Present Illness: Pt admit with acute encephalopathy.    Subjective Data      Prior Functioning       Cognition   Cognition Arousal/Alertness: Awake/alert Behavior During Therapy: Flat affect Overall Cognitive Status: No family/caregiver present to determine baseline cognitive functioning Area of Impairment: Attention;Following commands Following Commands: Follows one step commands inconsistently Difficult to assess due to: Level of arousal    Mobility  Bed Mobility Bed Mobility: Right Sidelying to Sit;Sitting - Scoot to Edge of Bed;Sit to Supine Right Sidelying to Sit: 1: +2 Total assist Right Sidelying to Sit: Patient Percentage: 0% Sitting - Scoot to Edge of Bed: 1: +1 Total assist Sit to Supine: 1: +2 Total assist Sit to Supine: Patient Percentage: 10% Details for Bed Mobility Assistance: pt with minimal movement and participation during mobility.  No active movement noted in bil LEs.      Exercises  Other Exercises Other Exercises: Attempted reaching with bilateral UE's/one at a time while sitting, pt unable.   Balance Balance Balance Assessed: Yes Static Sitting Balance Static Sitting - Balance Support: Bilateral upper extremity supported;Feet supported Static Sitting - Level of Assistance: 3: Mod assist;2: Max assist Static Sitting - Comment/# of Minutes: Pt mostlymax A today to maintain sitting balance with consistent cues, pt was mod A briefly. Sat EOB total today.   End of Session OT - End of Session Activity Tolerance: Patient limited by fatigue;Patient limited by lethargy Patient left: in bed;with call bell/phone within reach;with bed alarm set  GO     Roselie Awkward Dixon 10/05/2012, 1:11 PM

## 2012-10-06 ENCOUNTER — Inpatient Hospital Stay (HOSPITAL_COMMUNITY): Payer: Medicare Other

## 2012-10-06 DIAGNOSIS — I428 Other cardiomyopathies: Secondary | ICD-10-CM

## 2012-10-06 LAB — GLUCOSE, CAPILLARY: Glucose-Capillary: 196 mg/dL — ABNORMAL HIGH (ref 70–99)

## 2012-10-06 LAB — URINE CULTURE: Colony Count: NO GROWTH

## 2012-10-06 LAB — BASIC METABOLIC PANEL
BUN: 24 mg/dL — ABNORMAL HIGH (ref 6–23)
Chloride: 113 mEq/L — ABNORMAL HIGH (ref 96–112)
Glucose, Bld: 187 mg/dL — ABNORMAL HIGH (ref 70–99)
Potassium: 4.3 mEq/L (ref 3.5–5.1)

## 2012-10-06 MED ORDER — SODIUM CHLORIDE 0.9 % IV BOLUS (SEPSIS)
500.0000 mL | Freq: Once | INTRAVENOUS | Status: AC
  Administered 2012-10-06: 500 mL via INTRAVENOUS

## 2012-10-06 MED ORDER — INSULIN DETEMIR 100 UNIT/ML ~~LOC~~ SOLN
12.0000 [IU] | Freq: Every day | SUBCUTANEOUS | Status: DC
  Administered 2012-10-06 – 2012-10-10 (×5): 12 [IU] via SUBCUTANEOUS
  Filled 2012-10-06 (×6): qty 0.12

## 2012-10-06 NOTE — Progress Notes (Signed)
[facility] PROGRESS NOTE  Shaun Whitney:[id number] DOB: 17-Oct-1930 DOA: 09/28/2012 PCP: Kirstie Peri, MD  Brief narrative  79 year old male who was recently hospitalized at [facility] on 09/09/2012 for confusion and generalized weakness.During that admission, it was felt the patient may have been having focal seizures and he was started on Tegretol . He was discharged to a SNF. Two weeks later patient developed increasing lethargy and poor oral intake. He was readmitted to [facility] on 09/26/2012. 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 He was found to be in acute renal failure with hypernatremia and was hydrated. He remained confused and encephalopathic. Family spoke with the patient's primary care physician and requested the patient be transferred to [facility].   Assessment/Plan:  Acute toxic metabolic encephalopathy  Initially thought due to hypernatremia and fever with UTI  -prior MRI reported as negative  -EEG suggests encephalopathy, no evidence of seizure activity or foci  Patient continues to be encephalopathic. Also had a fever on 8/4. Fever workup so far negative. He does seem to have some left-sided weakness on exam however difficult to assess given his encephalopathy. I repeated a head CT which is unremarkable except for chronic small vessel disease. neurology consulted and will follow up with the recommendations.  Fever/leukocytosis  -positive UA positive but urine culture negative  abx discontinued ( zosyn) but was resumed on 8/4 along with vanco as patient became febrile again to 102.103F. Blood cx negative. CXR negative for infiltrate  Dehydration with hypernatremia  -resolved with IV fluids  -was on Lasix prior to admission   LLE edema/acute DVT  -venous duplex shows extensive DVT LLE  --on full dose Lovenox since 7/31  -switch to Xarelto when taking po well   Acute renal failure on CKD (chronic kidney disease) stage 3, GFR 30-59  ml/min  -baseline Scr 1.18 with GFR around 60  -likely due to dehydration - granular cast in UA suggest ATN as well  -[place] normal  -mildly deranged creatinine . monitor closely   Chronic diastolic congestive heart failure  -Follows with  cardiology  -euvolemic at present   Hypokalemia  -relpenished   Stage 2 sacral decubitus  -appreciate Wound RN eval  -  Mild Dysphagia  -SLP eval rec D1 diet w/ nectar liquids   Hyperglycemia  -Last A1c 6.5  -fsg stable   HTN  -improved  -continue to hold ACE inhibitor and Lasix for now   CAD (coronary artery disease)  -Stable   Thrombocytopenia, unspecified  resolved   Anemia  -hgb stable   ?Seizure disorder  -Recently started on Tegretol for suspected focal seizures - has been on hold due to lethargy  -EEG without evidence of seizure   DVT prophylaxis: full dose Lovenox  Code Status: Full  Family Communication: No family at bedside  Disposition Plan  SNF once improved    Consultants:  Neurology consulted and  Procedures:  EEG  LLE doppler  Antibiotics:  Zosyn 7/29 >> 8/1  8/4 >>  Vancomycin 8/4>>   HPI/Subjective:  Patient awake but continues to be very disoriented and non- communicative  Objective: Filed Vitals:   10/06/12 1014  BP: 111/63  Pulse:   Temp:   Resp:     Intake/Output Summary (Last 24 hours) at 10/06/12 1442 Last data filed at 10/06/12 1159  Gross per 24 hour  Intake   2120 ml  Output   1276 ml  Net    844 ml   [employer]  10/03/12 2038 10/04/12 2151 10/05/12 2048  Weight: 86.7 kg (191 lb 2.2 oz) 84.9 kg (187 lb 2.7 oz) 85.9 kg (189 lb 6 oz)    Exam: General: elderly frail male in NAD  HEENT: no pallor, moist mucosa  CHEST: Clear to auscultation bilaterally  Cardiovascular: Regular rate and rhythm No murmur gallop or rub normal  Abdomen: soft, Nontender, nondistended, soft, bowel sounds positive,  Musculoskeletal: warm, swelling over left leg with 2+ pitting  edema Neurological: Awake and occasionally follows few simple commands,Not oriented     Data Reviewed: Basic Metabolic Panel:  Recent Labs Lab 10/01/12 0516 10/01/12 1814 10/01/12 1956 10/02/12 0530 10/03/12 0435 10/05/12 0500 10/06/12 0500  NA 152*  --   --  144 142 144 145  K 2.6*  --  2.8* 2.5* 2.6* 3.9 4.3  CL 119*  --   --  110 108 110 113*  CO2 21  --   --  20 24 24 23   GLUCOSE 202*  --   --  224* 185* 229* 187*  BUN 27*  --   --  20 14 20  24*  CREATININE 1.79*  --   --  1.58* 1.29 1.42* 1.46*  CALCIUM 8.7  --   --  8.2* 8.6 8.5 8.7  MG  --  2.0  --   --  1.9  --   --    Liver Function Tests:  Recent Labs Lab 10/01/12 0516  AST 38*  ALT 32  ALKPHOS 301*  BILITOT 2.0*  PROT 6.3  ALBUMIN 1.8*   No results found for this basename: LIPASE, AMYLASE,  in the last 168 hours No results found for this basename: AMMONIA,  in the last 168 hours CBC:  Recent Labs Lab 10/01/12 0516 10/01/12 1814 10/02/12 0908 10/04/12 0510 10/05/12 0500  WBC 17.5* 14.4* 11.5* 10.4 10.5  NEUTROABS 13.8*  --   --   --   --   HGB 9.9* 9.6* 9.3* 10.7* 9.4*  HCT 28.8* 27.8* 27.4* 30.9* 27.4*  MCV 100.0 98.6 98.6 97.2 98.6  PLT 156 154 167 258 261   Cardiac Enzymes: No results found for this basename: CKTOTAL, CKMB, CKMBINDEX, TROPONINI,  in the last 168 hours BNP (last 3 results) No results found for this basename: PROBNP,  in the last 8760 hours CBG:  Recent Labs Lab 10/05/12 1141 10/05/12 1700 10/05/12 2312 10/06/12 0722 10/06/12 1220  GLUCAP 221* 181* 234* 158* 196*    Recent Results (from the past 240 hour(s))  MRSA PCR SCREENING     Status: None   Collection Time    09/28/12  3:51 PM      Result Value Range Status   MRSA by PCR NEGATIVE  NEGATIVE Final   Comment:            The GeneXpert MRSA Assay (FDA     approved for NASAL specimens     only), is one component of a     comprehensive MRSA colonization     surveillance program. It is not     intended to  diagnose MRSA     infection nor to guide or     monitor treatment for     MRSA infections.  URINE CULTURE     Status: None   Collection Time    09/28/12  6:18 PM      Result Value Range Status   Specimen Description URINE, CLEAN CATCH   Final   Special Requests NONE   Final  Culture  Setup Time 09/28/2012 21:39   Final   Colony Count NO GROWTH   Final   Culture NO GROWTH   Final   Report Status 09/29/2012 FINAL   Final  CULTURE, BLOOD (ROUTINE X 2)     Status: None   Collection Time    09/28/12  9:15 PM      Result Value Range Status   Specimen Description BLOOD ARM LEFT   Final   Special Requests BOTTLES DRAWN AEROBIC AND ANAEROBIC 10CC   Final   Culture  Setup Time     Final   Value: 09/29/2012 03:33     Performed at [facility]   Culture     Final   Value: NO GROWTH 5 DAYS     Performed at [facility]   Report Status 10/05/2012 FINAL   Final  CULTURE, BLOOD (ROUTINE X 2)     Status: None   Collection Time    09/28/12  9:25 PM      Result Value Range Status   Specimen Description BLOOD HAND RIGHT   Final   Special Requests BOTTLES DRAWN AEROBIC ONLY 10CC   Final   Culture  Setup Time     Final   Value: 09/29/2012 03:33     Performed at [facility]   Culture     Final   Value: NO GROWTH 5 DAYS     Performed at [facility]   Report Status 10/05/2012 FINAL   Final  URINE CULTURE     Status: None   Collection Time    09/30/12 10:28 AM      Result Value Range Status   Specimen Description URINE, RANDOM   Final   Special Requests NONE   Final   Culture  Setup Time 09/30/2012 11:35   Final   Colony Count NO GROWTH   Final   Culture NO GROWTH   Final   Report Status 10/01/2012 FINAL   Final  CULTURE, BLOOD (ROUTINE X 2)     Status: None   Collection Time    10/04/12  5:50 PM      Result Value Range Status   Specimen Description BLOOD RIGHT ARM   Final   Special Requests BOTTLES DRAWN AEROBIC AND ANAEROBIC 10CC   Final   Culture   Setup Time     Final   Value: 10/04/2012 22:52     Performed at [facility]   Culture     Final   Value:        BLOOD CULTURE RECEIVED NO GROWTH TO DATE CULTURE WILL BE HELD FOR 5 DAYS BEFORE ISSUING A FINAL NEGATIVE REPORT     Performed at [facility]   Report Status PENDING   Incomplete  CULTURE, BLOOD (ROUTINE X 2)     Status: None   Collection Time    10/04/12  5:57 PM      Result Value Range Status   Specimen Description BLOOD RIGHT ARM   Final   Special Requests BOTTLES DRAWN AEROBIC AND ANAEROBIC 10CC   Final   Culture  Setup Time     Final   Value: 10/04/2012 22:53     Performed at [facility]   Culture     Final   Value:        BLOOD CULTURE RECEIVED NO GROWTH TO DATE CULTURE WILL BE HELD FOR 5 DAYS BEFORE ISSUING A FINAL NEGATIVE REPORT  Performed at [facility]   Report Status PENDING   Incomplete  URINE CULTURE     Status: None   Collection Time    10/04/12  6:35 PM      Result Value Range Status   Specimen Description URINE, CLEAN CATCH   Final   Special Requests NONE   Final   Culture  Setup Time     Final   Value: 10/04/2012 20:43     Performed at [facility] Count     Final   Value: NO GROWTH     Performed at [facility]   Culture     Final   Value: NO GROWTH     Performed at [facility]   Report Status 10/06/2012 FINAL   Final     Studies: Dg Chest 2 View  10/05/2012   *RADIOLOGY REPORT*  Clinical Data: Fever, shortness of breath, question pneumonia, history hypertension, diabetes, coronary artery disease, CHF  CHEST - 2 VIEW  Comparison: 09/28/2012  Findings: Enlargement of cardiac silhouette. Atherogenic calcification aorta. Mediastinal contours and pulmonary vascularity normal. Decreased lung volumes with minimal bibasilar atelectasis. No definite acute infiltrate, pleural effusion or pneumothorax. Bones demineralized.  IMPRESSION: Enlargement of cardiac silhouette. No definite acute  abnormalities.   Original Report Authenticated By: Ulyses Southward, M.D.   Ct Head Wo Contrast  10/06/2012   *RADIOLOGY REPORT*  Clinical Data: Altered mental status.  Left-sided weakness. Diabetic hypertensive patient.  CT HEAD WITHOUT CONTRAST  Technique:  Contiguous axial images were obtained from the base of the skull through the vertex without contrast.  Comparison: 09/27/2012 brain MR. 09/26/2012 and 05/22/2008 head CT.  Findings: Motion degraded exam.  No intracranial hemorrhage.  Small vessel disease type changes without CT findings of large acute infarct.  Global atrophy.  Ventricular prominence may be related to atrophy although difficult to exclude a mild component hydrocephalus.  No intracranial mass lesion detected on this unenhanced exam.  Mastoid air cells, middle ear cavities and visualized paranasal sinuses are clear.  Orbital structures unremarkable.  IMPRESSION: Motion degraded exam.  No intracranial hemorrhage.  Small vessel disease type changes without CT findings of large acute infarct.  Global atrophy.  Ventricular prominence may be related to atrophy although difficult to exclude a mild component hydrocephalus.   Original Report Authenticated By: Lacy Duverney, M.D.    Scheduled Meds: . carvedilol  12.5 mg Oral BID  . enoxaparin (LOVENOX) injection  80 mg Subcutaneous Q12H  . feeding supplement  1 Container Oral TID BM  . insulin aspart  0-15 Units Subcutaneous TID WC  . insulin aspart  0-5 Units Subcutaneous QHS  . insulin detemir  10 Units Subcutaneous QHS  . piperacillin-tazobactam (ZOSYN)  IV  3.375 g Intravenous Q8H  . sodium chloride  3 mL Intravenous Q12H  . vancomycin (VANCOCIN) 750 mg IVPB  750 mg Intravenous Q12H   Continuous Infusions: . 0.9 % NaCl with KCl 20 mEq / L 75 mL/hr at 10/06/12 0819      Time spent: 25 minutes    Scotty Weigelt  [facility] Pager [phone] If 7PM-7AM, please contact night-coverage at [email or web address], password [facility] 10/06/2012,  2:42 PM  LOS: 8 days

## 2012-10-06 NOTE — Progress Notes (Signed)
BP 111/63 post bolus. Will continue to monitor.

## 2012-10-06 NOTE — Progress Notes (Signed)
ANTICOAGULATION CONSULT NOTE - Follow Up Consult  Pharmacy Consult for Lovenox Indication: DVT  No Known Allergies  Patient Measurements: Height: 5\' 9"  (175.3 cm) Weight: 189 lb 6 oz (85.9 kg) IBW/kg (Calculated) : 70.7   Vital Signs: Temp: 99.8 F (37.7 C) (08/06 0925) Temp src: Oral (08/06 0925) BP: 111/63 mmHg (08/06 1014) Pulse Rate: 70 (08/06 0925)  Labs:  Recent Labs  10/04/12 0510 10/05/12 0500 10/06/12 0500  HGB 10.7* 9.4*  --   HCT 30.9* 27.4*  --   PLT 258 261  --   CREATININE  --  1.42* 1.46*    Estimated Creatinine Clearance: 43.8 ml/min (by C-G formula based on Cr of 1.46).   Assessment: 82 YOM being treated for LLE DVT, encephalopathy, and AoCKD. His SCr improved to 1.46 today with a CrCl of ~49mL/min. H/H fell slightly again, platelets remain stable.  No bleeding noted.  Goal of Therapy:  Anti-Xa level 0.6-1.2 units/ml 4hrs after LMWH dose given Monitor platelets by anticoagulation protocol: Yes   Plan:  1. Cpntinue Lovenox  80mg  subq Q12hr. 2. CBC Q72hr 3. F/u renal function, s/s bleeding, clinical progression, transition to oral anticoagulants  Tad Moore, BCPS  Clinical Pharmacist Pager [phone]  10/06/2012 10:44 AM

## 2012-10-06 NOTE — Consult Note (Signed)
NEURO HOSPITALIST CONSULT NOTE    Reason for Consult: Encephalopathy  HPI:                                                                                                                                          Shaun Whitney is an 79 y.o. male recent hospitalization to [facility] on 7/10 for confusion and generalized weakness which per son was fairly rapid onset (over a period of weeks.  Prior to hospitalization patient was having intermittent non rhythmic jerking which also progressively became worse. Per son, while in the hospital he had a MRI which was positive "for right sided stroke". Patient was discharged to SNF with tegretol for possible focal seizures.  HE stopped having the jerking motions on Tegretol. During his stay in the SNF he was noted to become more somnolent and have decreased oral intake. He was brought to [facility] on 7/27 for evaluation. There he underwent a MRI which is reported to be normal but he was found to be in ARF and hypernatremic. Patient has remained encephalopathic and thus transferred to [facility] hospital for further evaluation by internal medicine and neurology. Initial presenting labs showed sodium of 163, potassium 3.4, ALK phos 291, WBC 17.1, UTI.  Since admission on 09/26/12 patient sodium has been corrected (on 10/02/12), potassium dropped to 2.5 but has also been corrected, WBC increased to 17.5 and returned to normal on 10/02/12. Patient currently is being treated with zosyn. Due to patient not returning to baseline cognition neurology has been consulted.   Ammonia,B12, Folate were all WNL.  CT head showed no acute bleed, or infarct.   Past Medical History  Diagnosis Date  . Aortic stenosis   . Diabetes mellitus type II   . Hypertension   . Gout   . GERD (gastroesophageal reflux disease)     Past Surgical History  Procedure Laterality Date  . Joint replacement      right knee  . Tee without cardioversion  01/15/2012    Procedure:  TRANSESOPHAGEAL ECHOCARDIOGRAM (TEE);  Surgeon: Laurey Morale, MD;  Location: [facility] ENDOSCOPY;  Service: Cardiovascular;  Laterality: N/A;    Family History: Mother HTN Father HTN  Social History:  reports that he quit smoking about 31 years ago. His smoking use included Cigarettes. He started smoking about 61 years ago. He has a 30 pack-year smoking history. He has never used smokeless tobacco. He reports that he does not drink alcohol or use illicit drugs.  No Known Allergies  MEDICATIONS:  Prior to Admission:  Prescriptions prior to admission  Medication Sig Dispense Refill  . allopurinol (ZYLOPRIM) 300 MG tablet Take 300 mg by mouth daily.      Marland Kitchen aspirin EC 81 MG tablet Take 81 mg by mouth daily.      . bisacodyl (DULCOLAX) 10 MG suppository Place 10 mg rectally daily as needed for constipation.      . carbamazepine (TEGRETOL) 200 MG tablet Take 200 mg by mouth 2 (two) times daily.      . carvedilol (COREG) 12.5 MG tablet Take 12.5 mg by mouth 2 (two) times daily.      . furosemide (LASIX) 40 MG tablet Take 1 tablet (40 mg total) by mouth daily.  30 tablet  6  . lisinopril (PRINIVIL,ZESTRIL) 40 MG tablet Take 1 tablet (40 mg total) by mouth daily.  90 tablet  3  . magnesium hydroxide (MILK OF MAGNESIA) 400 MG/5ML suspension Take 30 mLs by mouth daily as needed for constipation.      . metFORMIN (GLUCOPHAGE) 500 MG tablet Take 500 mg by mouth 2 (two) times daily with a meal.       . omeprazole (PRILOSEC) 20 MG capsule Take 1 capsule by mouth 2 (two) times daily.       . promethazine (PHENERGAN) 25 MG suppository Place 25 mg rectally every 4 (four) hours as needed for nausea.       Scheduled: . carvedilol  12.5 mg Oral BID  . enoxaparin (LOVENOX) injection  80 mg Subcutaneous Q12H  . feeding supplement  1 Container Oral TID BM  . insulin aspart  0-15 Units Subcutaneous  TID WC  . insulin aspart  0-5 Units Subcutaneous QHS  . insulin detemir  10 Units Subcutaneous QHS  . piperacillin-tazobactam (ZOSYN)  IV  3.375 g Intravenous Q8H  . sodium chloride  3 mL Intravenous Q12H  . vancomycin (VANCOCIN) 750 mg IVPB  750 mg Intravenous Q12H     ROS:                                                                                                                                       History obtained from unobtainable from patient due to mental status    Blood pressure 111/63, pulse 70, temperature 99.8 F (37.7 C), temperature source Oral, resp. rate 16, height 5\' 9"  (1.753 m), weight 85.9 kg (189 lb 6 oz), SpO2 98.00%.   Neurologic Examination:  Mental Status: Alert, not oriented follows only simple commands such as smiling, wiggling his toes, lifting his arm.  He perked up and said "hey" when his daughter walked into the room but would not name his family members.  He would track our movements and look toward people talking to him.  Cranial Nerves: II: Discs flat bilaterally; Visual fields grossly normal--blinks to threat bilaterally, pupils equal, round, reactive to light and accommodation III,IV, VI: ptosis not present, extra-ocular motions intact bilaterally V,VII: smile asymmetric on the left (old), facial light touch sensation normal bilaterally VIII: looks toward voice when called IX,X: gag reflex present XI: bilateral shoulder shrug XII: midline tongue extension Motor: Holds right arm up antigravity, cannot hold left arm antigravity (old from stroke in past) bilateral legs move minimally and he has sever pain to passive ROM to bilateral legs.    --Decreased bulk in bilateral legs Sensory: responded to noxious stimuli in all extremities but LE had slightly delayed response.  Deep Tendon Reflexes:  Right: Upper Extremity   Left: Upper extremity    biceps (C-5 to C-6) 2/4   biceps (C-5 to C-6) 2/4 tricep (C7) 2/4    triceps (C7) 2/4 Brachioradialis (C6) 2/4  Brachioradialis (C6) 2/4  Lower Extremity Lower Extremity  quadriceps (L-2 to L-4) 0/4   quadriceps (L-2 to L-4) 0/4 Achilles (S1) 0/4   Achilles (S1) 0/4  Plantars: Mute bilaterally Cerebellar: Unable to assess CV: pulses palpable throughout    No results found for this basename: cbc, bmp, coags, chol, tri, ldl, hga1c    Results for orders placed during the hospital encounter of 09/28/12 (from the past 48 hour(s))  GLUCOSE, CAPILLARY     Status: Abnormal   Collection Time    10/04/12  4:59 PM      Result Value Range   Glucose-Capillary 257 (*) 70 - 99 mg/dL   Comment 1 Documented in Chart     Comment 2 Notify RN    BLOOD GAS, ARTERIAL     Status: Abnormal   Collection Time    10/04/12  5:20 PM      Result Value Range   O2 Content 2.0     Delivery systems NASAL CANNULA     pH, Arterial 7.479 (*) 7.350 - 7.450   pCO2 arterial 34.6 (*) 35.0 - 45.0 mmHg   pO2, Arterial 70.8 (*) 80.0 - 100.0 mmHg   Bicarbonate 24.8 (*) 20.0 - 24.0 mEq/L   TCO2 25.8  0 - 100 mmol/L   Acid-Base Excess 2.0  0.0 - 2.0 mmol/L   O2 Saturation 93.0     Patient temperature 102.7     Collection site RIGHT RADIAL     Drawn by [id number]     Sample type ARTERIAL DRAW     Allens test (pass/fail) PASS  PASS  CULTURE, BLOOD (ROUTINE X 2)     Status: None   Collection Time    10/04/12  5:50 PM      Result Value Range   Specimen Description BLOOD RIGHT ARM     Special Requests BOTTLES DRAWN AEROBIC AND ANAEROBIC 10CC     Culture  Setup Time       Value: 10/04/2012 22:52     Performed at [facility]   Culture       Value:        BLOOD CULTURE RECEIVED NO GROWTH TO DATE CULTURE WILL BE HELD FOR 5 DAYS BEFORE ISSUING A FINAL NEGATIVE REPORT  Performed at [facility]   Report Status PENDING    CULTURE, BLOOD (ROUTINE X 2)     Status: None   Collection Time    10/04/12   5:57 PM      Result Value Range   Specimen Description BLOOD RIGHT ARM     Special Requests BOTTLES DRAWN AEROBIC AND ANAEROBIC 10CC     Culture  Setup Time       Value: 10/04/2012 22:53     Performed at [facility]   Culture       Value:        BLOOD CULTURE RECEIVED NO GROWTH TO DATE CULTURE WILL BE HELD FOR 5 DAYS BEFORE ISSUING A FINAL NEGATIVE REPORT     Performed at [facility]   Report Status PENDING    URINE CULTURE     Status: None   Collection Time    10/04/12  6:35 PM      Result Value Range   Specimen Description URINE, CLEAN CATCH     Special Requests NONE     Culture  Setup Time       Value: 10/04/2012 20:43     Performed at [facility] Count       Value: NO GROWTH     Performed at [facility]   Culture       Value: NO GROWTH     Performed at [facility]   Report Status 10/06/2012 FINAL    URINALYSIS, ROUTINE W REFLEX MICROSCOPIC     Status: Abnormal   Collection Time    10/04/12  6:35 PM      Result Value Range   Color, Urine AMBER (*) YELLOW   Comment: BIOCHEMICALS MAY BE AFFECTED BY COLOR   APPearance CLOUDY (*) CLEAR   Specific Gravity, Urine 1.018  1.005 - 1.030   pH 5.5  5.0 - 8.0   Glucose, UA 100 (*) NEGATIVE mg/dL   Hgb urine dipstick MODERATE (*) NEGATIVE   Bilirubin Urine SMALL (*) NEGATIVE   Ketones, ur NEGATIVE  NEGATIVE mg/dL   Protein, ur 161 (*) NEGATIVE mg/dL   Urobilinogen, UA 1.0  0.0 - 1.0 mg/dL   Nitrite NEGATIVE  NEGATIVE   Leukocytes, UA SMALL (*) NEGATIVE  URINE MICROSCOPIC-ADD ON     Status: Abnormal   Collection Time    10/04/12  6:35 PM      Result Value Range   Squamous Epithelial / LPF RARE  RARE   WBC, UA 3-6  <3 WBC/hpf   RBC / HPF 7-10  <3 RBC/hpf   Bacteria, UA FEW (*) RARE   Casts HYALINE CASTS (*) NEGATIVE   Sperm, UA PRESENT     Urine-Other MUCOUS PRESENT    GLUCOSE, CAPILLARY     Status: Abnormal   Collection Time    10/04/12  6:44 PM      Result Value  Range   Glucose-Capillary 222 (*) 70 - 99 mg/dL  GLUCOSE, CAPILLARY     Status: Abnormal   Collection Time    10/04/12  9:54 PM      Result Value Range   Glucose-Capillary 182 (*) 70 - 99 mg/dL  BASIC METABOLIC PANEL     Status: Abnormal   Collection Time    10/05/12  5:00 AM      Result Value Range   Sodium 144  135 - 145 mEq/L   Potassium 3.9  3.5 - 5.1 mEq/L  Chloride 110  96 - 112 mEq/L   CO2 24  19 - 32 mEq/L   Glucose, Bld 229 (*) 70 - 99 mg/dL   BUN 20  6 - 23 mg/dL   Creatinine, Ser 1.61 (*) 0.50 - 1.35 mg/dL   Calcium 8.5  8.4 - 09.6 mg/dL   GFR calc non Af Amer 45 (*) >90 mL/min   GFR calc Af Amer 52 (*) >90 mL/min   Comment:            The eGFR has been calculated     using the CKD EPI equation.     This calculation has not been     validated in all clinical     situations.     eGFR's persistently     <90 mL/min signify     possible Chronic Kidney Disease.  CBC     Status: Abnormal   Collection Time    10/05/12  5:00 AM      Result Value Range   WBC 10.5  4.0 - 10.5 K/uL   RBC 2.78 (*) 4.22 - 5.81 MIL/uL   Hemoglobin 9.4 (*) 13.0 - 17.0 g/dL   HCT 04.5 (*) 40.9 - 81.1 %   MCV 98.6  78.0 - 100.0 fL   MCH 33.8  26.0 - 34.0 pg   MCHC 34.3  30.0 - 36.0 g/dL   RDW 91.4  78.2 - 95.6 %   Platelets 261  150 - 400 K/uL  GLUCOSE, CAPILLARY     Status: Abnormal   Collection Time    10/05/12  8:32 AM      Result Value Range   Glucose-Capillary 198 (*) 70 - 99 mg/dL   Comment 1 Documented in Chart     Comment 2 Notify RN    GLUCOSE, CAPILLARY     Status: Abnormal   Collection Time    10/05/12 11:41 AM      Result Value Range   Glucose-Capillary 221 (*) 70 - 99 mg/dL   Comment 1 Notify RN     Comment 2 Documented in Chart    GLUCOSE, CAPILLARY     Status: Abnormal   Collection Time    10/05/12  5:00 PM      Result Value Range   Glucose-Capillary 181 (*) 70 - 99 mg/dL   Comment 1 Notify RN     Comment 2 Documented in Chart    GLUCOSE, CAPILLARY     Status:  Abnormal   Collection Time    10/05/12 11:12 PM      Result Value Range   Glucose-Capillary 234 (*) 70 - 99 mg/dL  BASIC METABOLIC PANEL     Status: Abnormal   Collection Time    10/06/12  5:00 AM      Result Value Range   Sodium 145  135 - 145 mEq/L   Potassium 4.3  3.5 - 5.1 mEq/L   Chloride 113 (*) 96 - 112 mEq/L   CO2 23  19 - 32 mEq/L   Glucose, Bld 187 (*) 70 - 99 mg/dL   BUN 24 (*) 6 - 23 mg/dL   Creatinine, Ser 2.13 (*) 0.50 - 1.35 mg/dL   Calcium 8.7  8.4 - 08.6 mg/dL   GFR calc non Af Amer 44 (*) >90 mL/min   GFR calc Af Amer 51 (*) >90 mL/min   Comment:            The eGFR has been calculated     using  the CKD EPI equation.     This calculation has not been     validated in all clinical     situations.     eGFR's persistently     <90 mL/min signify     possible Chronic Kidney Disease.  GLUCOSE, CAPILLARY     Status: Abnormal   Collection Time    10/06/12  7:22 AM      Result Value Range   Glucose-Capillary 158 (*) 70 - 99 mg/dL   Comment 1 Documented in Chart     Comment 2 Notify RN    GLUCOSE, CAPILLARY     Status: Abnormal   Collection Time    10/06/12 12:20 PM      Result Value Range   Glucose-Capillary 196 (*) 70 - 99 mg/dL   Comment 1 Documented in Chart     Comment 2 Notify RN      Dg Chest 2 View  10/05/2012   *RADIOLOGY REPORT*  Clinical Data: Fever, shortness of breath, question pneumonia, history hypertension, diabetes, coronary artery disease, CHF  CHEST - 2 VIEW  Comparison: 09/28/2012  Findings: Enlargement of cardiac silhouette. Atherogenic calcification aorta. Mediastinal contours and pulmonary vascularity normal. Decreased lung volumes with minimal bibasilar atelectasis. No definite acute infiltrate, pleural effusion or pneumothorax. Bones demineralized.  IMPRESSION: Enlargement of cardiac silhouette. No definite acute abnormalities.   Original Report Authenticated By: Ulyses Southward, M.D.   Ct Head Wo Contrast  10/06/2012   *RADIOLOGY REPORT*   Clinical Data: Altered mental status.  Left-sided weakness. Diabetic hypertensive patient.  CT HEAD WITHOUT CONTRAST  Technique:  Contiguous axial images were obtained from the base of the skull through the vertex without contrast.  Comparison: 09/27/2012 brain MR. 09/26/2012 and 05/22/2008 head CT.  Findings: Motion degraded exam.  No intracranial hemorrhage.  Small vessel disease type changes without CT findings of large acute infarct.  Global atrophy.  Ventricular prominence may be related to atrophy although difficult to exclude a mild component hydrocephalus.  No intracranial mass lesion detected on this unenhanced exam.  Mastoid air cells, middle ear cavities and visualized paranasal sinuses are clear.  Orbital structures unremarkable.  IMPRESSION: Motion degraded exam.  No intracranial hemorrhage.  Small vessel disease type changes without CT findings of large acute infarct.  Global atrophy.  Ventricular prominence may be related to atrophy although difficult to exclude a mild component hydrocephalus.   Original Report Authenticated By: Lacy Duverney, M.D.   Assessment and plan discussed with with attending physician and they are in agreement.    Felicie Morn PA-C [facility] [phone]  10/06/2012, 3:45 PM   Assessment/Plan:  79 year old male with progressive decline since March, first starting with weakness that was felt to be due to stroke. Since Early July he has had progressive decline in cognition. The jerks described by the son sound more like a multifocal myoclonus than seizure.   Possible differential includes multifactorial delirium, degenerative disorder such as CJD, paraneoplastic or autoimmune encephalopathy. With the time course, I feel that infectious etiology is less likely.   1) Would start with repeating MRI to look for cortical ribboning 2) Repeat EEG  3) Following these studies would plan on LP to look for signs of inflammation.  4) TSH, RPR, ESR  Ritta Slot, MD [facility] [phone]  If 7pm- 7am, please page neurology on call at [phone].

## 2012-10-06 NOTE — Progress Notes (Signed)
Inpatient Diabetes Program Recommendations  AACE/ADA: New Consensus Statement on Inpatient Glycemic Control (2013)  Target Ranges:  Prepandial:   less than 140 mg/dL      Peak postprandial:   less than 180 mg/dL (1-2 hours)      Critically ill patients:  140 - 180 mg/dL   Results for LOGIN, MUCKLEROY (MRN [id number]) as of 10/06/2012 10:57  Ref. Range 10/05/2012 08:32 10/05/2012 11:41 10/05/2012 17:00 10/05/2012 23:12 10/06/2012 07:22  Glucose-Capillary Latest Range: 70-99 mg/dL 409 (H) 811 (H) 914 (H) 234 (H) 158 (H)    Inpatient Diabetes Program Recommendations Insulin - Basal: Please consider increasing Levemir to 12 units QHS.  Note:  Blood glucose over the past 24 hours has ranged from 158-234 mg/dl.  Currently patient is ordered Levemir 10 units QHS, Novolog 0-15 AC, and Novolog 0-5 HS for inpatient glycemic control.  Please consider increasing Levemir to 12 units QHS. Will continue to follow.  Thanks, Orlando Penner, RN, MSN, CCRN Diabetes Coordinator Inpatient Diabetes Program [phone]

## 2012-10-06 NOTE — Progress Notes (Signed)
BP 82/50 manual. MD notified. Orders received for 500 mL NS bolus. Bolus currently infusing.

## 2012-10-06 NOTE — Consult Note (Addendum)
WOC consult Note Reason for Consult: Initial WOC consult performed on 7/30; refer to previous progress notes for assessment and plan of care.  Previous deep tissue injury wound has evolved at this time into patchy areas of unstageable and other areas of stage 2 wounds.  Pressure Ulcer POA: Yes Measurement: 8X6cm to each side of bilat buttocks and extends across sacrum area in a butterfly shape. Wound bed: 15% yellow, 85% pink and moist Drainage (amount, consistency, odor) small amt yellow drainage, no odor Periwound: peeling edges surrounding wound as it continues to evolve. Dressing procedure/placement/frequency: Sacral foam dressing to protect and promote healing and absorb drainage.  If unstageable areas continue to expand as wound evolves, then may require chemical debridement at that time.  Air mattress to reduce pressure. Cammie Mcgee MSN, RN, CWOCN, [place], CNS [phone]

## 2012-10-06 NOTE — Progress Notes (Signed)
Speech Language Pathology Dysphagia Treatment Patient Details Name: Shaun Whitney MRN: [id number] DOB: 1930/07/11 Today's Date: 10/06/2012 Time: 1115-1205 SLP Time Calculation (min): 50 min  Assessment / Plan / Recommendation Clinical Impression  Diagnostic treatment completed focusing on diet tolerance of dysphagia 1 (puree) and nectar thick liquids and for possible upgrade.  Nursing reports this am patient holding meds crushed in puree requiring oral suction to clear.  Caregivers also report patient with oral holding during meals.  Patient observed directly with ice chips x3 with patient readily accepting bolus.  Attempts made to masticate but  required max verbal cues to continue oral prep stage due to decreased sustained attention paired with poor oral awareness.  Puree consistency administered x4 requiring max verbal cues for patient to initiate swallow. Moderate amount of residue paired with secretions suctioned out.  Recommend the following: Continue current diet consistency of dysphagia 1 and nectar thick liquids with total assist with all meals, complete oral care with use of oral suction prior and after meals, cue patient to swallow prior to administering another bite/sip, PO's only when fully alert. ST to continue in acute care setting for POC.      Diet Recommendation  Continue with Current Diet: Dysphagia 1 (puree);Nectar-thick liquid    SLP Plan Continue with current plan of care      Swallowing Goals  SLP Swallowing Goals Patient will utilize recommended strategies during swallow to increase swallowing safety with: Moderate cueing Swallow Study Goal #2 - Progress: Progressing toward goal  General Temperature Spikes Noted: No Respiratory Status: Supplemental O2 delivered via (comment) (per nasal cannula ) Behavior/Cognition: Alert;Cooperative;Distractible;Requires cueing Oral Cavity - Dentition: Poor condition Patient Positioning: Upright in bed  Oral Cavity - Oral  Hygiene Does patient have any of the following "at risk" factors?: Oxygen therapy - cannula, mask, simple oxygen devices;Diet - patient on thickened liquids;Other - dysphagia;Mucous Membranes - reddened Patient is HIGH RISK - Oral Care Protocol followed (see row info): Yes Patient is AT RISK - Oral Care Protocol followed (see row info): Yes   Dysphagia Treatment Treatment focused on: Skilled observation of diet tolerance;Upgraded PO texture trials;Facilitation of oral preparatory phase;Facilitation of oral phase;Facilitation of pharyngeal phase;Utilization of compensatory strategies Treatment Methods/Modalities: Skilled observation;Differential diagnosis Patient observed directly with PO's: Yes Type of PO's observed: Dysphagia 1 (puree);Ice chips Feeding: Total assist Oral Phase Signs & Symptoms: Anterior loss/spillage;Prolonged oral phase;Prolonged bolus formation Pharyngeal Phase Signs & Symptoms: Suspected delayed swallow initiation Type of cueing: Verbal;Visual Amount of cueing: Maximal   GO    Shaun Fowler MS, CCC-SLP [phone] [facility] 10/06/2012, 12:38 PM

## 2012-10-07 ENCOUNTER — Inpatient Hospital Stay (HOSPITAL_COMMUNITY): Payer: Medicare Other

## 2012-10-07 ENCOUNTER — Ambulatory Visit (HOSPITAL_COMMUNITY): Payer: Medicare Other

## 2012-10-07 DIAGNOSIS — M25539 Pain in unspecified wrist: Secondary | ICD-10-CM

## 2012-10-07 DIAGNOSIS — E119 Type 2 diabetes mellitus without complications: Secondary | ICD-10-CM

## 2012-10-07 DIAGNOSIS — G479 Sleep disorder, unspecified: Secondary | ICD-10-CM

## 2012-10-07 LAB — GLUCOSE, CAPILLARY
Glucose-Capillary: 143 mg/dL — ABNORMAL HIGH (ref 70–99)
Glucose-Capillary: 134 mg/dL — ABNORMAL HIGH (ref 70–99)
Glucose-Capillary: 136 mg/dL — ABNORMAL HIGH (ref 70–99)

## 2012-10-07 LAB — URIC ACID
Uric Acid, Serum: 2.9 mg/dL — ABNORMAL LOW (ref 4.0–7.8)
Uric Acid, Serum: 2.9 mg/dL — ABNORMAL LOW (ref 4.0–7.8)

## 2012-10-07 LAB — CBC
HCT: 27.7 % — ABNORMAL LOW (ref 39.0–52.0)
MCH: 33.8 pg (ref 26.0–34.0)
MCV: 98.6 fL (ref 78.0–100.0)
Platelets: 328 10*3/uL (ref 150–400)
RBC: 2.81 MIL/uL — ABNORMAL LOW (ref 4.22–5.81)

## 2012-10-07 LAB — BASIC METABOLIC PANEL
BUN: 19 mg/dL (ref 6–23)
Chloride: 107 mEq/L (ref 96–112)
GFR calc Af Amer: 63 mL/min — ABNORMAL LOW (ref >90)
Potassium: 3.9 mEq/L (ref 3.5–5.1)
Sodium: 142 mEq/L (ref 135–145)

## 2012-10-07 LAB — RPR: RPR Ser Ql: NONREACTIVE

## 2012-10-07 MED ORDER — ENOXAPARIN SODIUM 80 MG/0.8ML ~~LOC~~ SOLN
80.0000 mg | Freq: Two times a day (BID) | SUBCUTANEOUS | Status: DC
  Administered 2012-10-08 – 2012-10-11 (×6): 80 mg via SUBCUTANEOUS
  Filled 2012-10-07 (×9): qty 0.8

## 2012-10-07 MED ORDER — LORAZEPAM 2 MG/ML IJ SOLN: 1.0000 mg | Freq: Once | INTRAMUSCULAR | Status: AC

## 2012-10-07 MED ORDER — INSULIN ASPART 100 UNIT/ML ~~LOC~~ SOLN
0.0000 [IU] | Freq: Three times a day (TID) | SUBCUTANEOUS | Status: DC
  Administered 2012-10-07 – 2012-10-09 (×6): 2 [IU] via SUBCUTANEOUS
  Administered 2012-10-10: 3 [IU] via SUBCUTANEOUS
  Administered 2012-10-10 (×2): 2 [IU] via SUBCUTANEOUS
  Administered 2012-10-11: 5 [IU] via SUBCUTANEOUS
  Administered 2012-10-11: 3 [IU] via SUBCUTANEOUS
  Administered 2012-10-11: 2 [IU] via SUBCUTANEOUS
  Administered 2012-10-12 (×2): 5 [IU] via SUBCUTANEOUS
  Administered 2012-10-12: 15 [IU] via SUBCUTANEOUS

## 2012-10-07 MED ORDER — LORAZEPAM 2 MG/ML IJ SOLN
INTRAMUSCULAR | Status: AC
  Administered 2012-10-07: 1 mg via INTRAVENOUS
  Filled 2012-10-07: qty 1

## 2012-10-07 NOTE — Progress Notes (Signed)
Subjective: No significant changes  Exam: Filed Vitals:   10/07/12 1821  BP: 131/65  Pulse: 78  Temp: 98.9 F (37.2 C)  Resp: 18   Gen: In bed, NAD MS: Does not follow commands today, but is awake and gauges the examiner. CN: Toes equal round and reactive, blinks to threat bilaterally Motor: Left hemiparesis some increased tone Sensory: Spinal stenosis stimuli by 4 DTR: Clonus in left arm   Impression: 79 year old male with progressive neurological decline most pronounced since 08-02-2024though possibly starting as early as his previous stroke in March. He has had episodes that sound like multifocal myoclonus, but they respond to verbal to Tegretol. His current antiepileptic therapy.  Possible differential includes multifactorial delirium, degenerative disorder such as CJD, paraneoplastic or autoimmune encephalopathy. With the time course, I feel that infectious etiology is less likely.    Recommendations: 1) repeat MRI to look for cortical ribbon  2) repeat EEG to look for periodic discharges 3) would favor doing a lumbar puncture to look for inflammatory changes which could suggest an autoimmune process as well as protein 1433(beta isoform) 4) in addition would send fungal stain, HSV, cell count with differential, CSF Lyme, VDRL CSF, oligoclonal bands and IgG index 5) we'll send thyroid antibodies  Ritta Slot, MD [facility] [phone]  If 7pm- 7am, please page neurology on call at [phone].

## 2012-10-07 NOTE — Progress Notes (Signed)
[facility] PROGRESS NOTE  Shaun Whitney RUE:[id number] DOB: [birth year] DOA: 09/28/2012 PCP: Kirstie Peri, MD  Brief narrative  79 year old male who was recently hospitalized at [facility] on 09/09/2012 for confusion and generalized weakness.During that admission, it was felt the patient may have been having focal seizures and he was started on Tegretol . He was discharged to a SNF. Two weeks later patient developed increasing lethargy and poor oral intake. He was readmitted to [facility] on 09/26/2012. 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 He was found to be in acute renal failure with hypernatremia and was hydrated. He remained confused and encephalopathic. Family spoke with the patient's primary care physician and requested the patient be transferred to [facility].   Assessment/Plan:  Acute toxic metabolic encephalopathy  Initially thought due to hypernatremia and fever with UTI  -prior MRI reported as negative  -EEG suggests encephalopathy, no evidence of seizure activity or foci  -Patient continues to be encephalopathic. Also had a fever on 8/4. Fever workup so far negative. He does seem to have some left-sided weakness on exam however difficult to assess given his encephalopathy. I repeated a head CT which is unremarkable except for chronic small vessel disease. TSH, RPR, B12 wnl Appreciate neurology reccommended. No clear etiology. Plan on repeating MRI brain, EEG and get LP. Spoke with IR Dr Hyacinth Meeker who agrees for LP tomorrow.  CSF labs ordered.   Left wrist drop Noted on exam today.  unclear of duration of this symptoms. Has tenderness to palpation of left wrist. Will check uric acid. ordered MRI c spine.  Fever/leukocytosis  -UA positive but urine culture negative  abx discontinued ( zosyn) but was resumed on 8/4 along with vanco as patient became febrile again to 102.38F. Blood cx negative. CXR negative for infiltrate  No clear source of  infection. Will d/c antibiotics.  Dehydration with hypernatremia  -resolved with IV fluids  -was on Lasix prior to admission   LLE edema/acute DVT  -venous duplex shows extensive DVT LLE  --on full dose Lovenox since 7/31 . Will hold dose as planned for LP in am. -switch to Xarelto when taking po well .   Acute renal failure on CKD (chronic kidney disease) stage 3, GFR 30-59 ml/min  -baseline Scr 1.18 with GFR around 60  -likely due to dehydration - granular cast in UA suggest ATN as well  -[place] normal   Chronic diastolic congestive heart failure  -Follows with [place] cardiology  -euvolemic at present   Hypokalemia  -relpenished   Stage 2 sacral decubitus  -appreciate Wound RN eval  -  Mild Dysphagia  -SLP eval rec D1 diet w/ nectar liquids   DM -Last A1c 6.5  -fsg stable   continue levemir and SSI  HTN  -improved  -continue to hold ACE inhibitor and Lasix for now   CAD (coronary artery disease)  -Stable   Thrombocytopenia, unspecified  resolved   Anemia  -hgb stable   ?Seizure disorder  -Recently started on Tegretol for suspected focal seizures - has been on hold due to lethargy  -EEG without evidence of seizure . Repeat EEG ordered    DVT prophylaxis: full dose Lovenox   Code Status: Full   Family Communication:Spoke with daughter in law denise over the phone  Disposition Plan  SNF once improved   Consultants:  Neurology   Procedures:  EEG  LLE doppler LP planned for 8/8  Antibiotics:  Zosyn 7/29 >> 8/1  8/4 >>  8/7 Vancomycin 8/4>> 8/7  HPI/Subjective:  No change in MS today. Has a left wrist drop on exam.    Objective: Filed Vitals:   10/07/12 0847  BP: 135/50  Pulse: 65  Temp: 98.2 F (36.8 C)  Resp: 18    Intake/Output Summary (Last 24 hours) at 10/07/12 1437 Last data filed at 10/06/12 2300  Gross per 24 hour  Intake 2117.08 ml  Output    451 ml  Net 1666.08 ml   Filed Weights   10/04/12 2151 10/05/12 2048 10/06/12  2145  Weight: 84.9 kg (187 lb 2.7 oz) 85.9 kg (189 lb 6 oz) 85.75 kg (189 lb 0.7 oz)    Exam:   General: elderly frail male in NAD  HEENT: no pallor, moist mucosa  CHEST: Clear to auscultation bilaterally  Cardiovascular: Regular rate and rhythm No murmur gallop or rub normal  Abdomen: soft, Nontender, nondistended, soft, bowel sounds positive,  Musculoskeletal: warm, swelling over left leg with 2+ pitting edema  Neurological: Awake and occasionally follows few simple commands,Not oriented . Left wrist drop   Data Reviewed: Basic Metabolic Panel:  Recent Labs Lab 10/01/12 0516 10/01/12 1814 10/01/12 1956 10/02/12 0530 10/03/12 0435 10/05/12 0500 10/06/12 0500  NA 152*  --   --  144 142 144 145  K 2.6*  --  2.8* 2.5* 2.6* 3.9 4.3  CL 119*  --   --  110 108 110 113*  CO2 21  --   --  20 24 24 23   GLUCOSE 202*  --   --  224* 185* 229* 187*  BUN 27*  --   --  20 14 20  24*  CREATININE 1.79*  --   --  1.58* 1.29 1.42* 1.46*  CALCIUM 8.7  --   --  8.2* 8.6 8.5 8.7  MG  --  2.0  --   --  1.9  --   --    Liver Function Tests:  Recent Labs Lab 10/01/12 0516  AST 38*  ALT 32  ALKPHOS 301*  BILITOT 2.0*  PROT 6.3  ALBUMIN 1.8*   No results found for this basename: LIPASE, AMYLASE,  in the last 168 hours No results found for this basename: AMMONIA,  in the last 168 hours CBC:  Recent Labs Lab 10/01/12 0516 10/01/12 1814 10/02/12 0908 10/04/12 0510 10/05/12 0500 10/07/12 0400  WBC 17.5* 14.4* 11.5* 10.4 10.5 9.3  NEUTROABS 13.8*  --   --   --   --   --   HGB 9.9* 9.6* 9.3* 10.7* 9.4* 9.5*  HCT 28.8* 27.8* 27.4* 30.9* 27.4* 27.7*  MCV 100.0 98.6 98.6 97.2 98.6 98.6  PLT 156 154 167 258 261 328   Cardiac Enzymes: No results found for this basename: CKTOTAL, CKMB, CKMBINDEX, TROPONINI,  in the last 168 hours BNP (last 3 results) No results found for this basename: PROBNP,  in the last 8760 hours CBG:  Recent Labs Lab 10/06/12 1220 10/06/12 1642  10/06/12 2138 10/07/12 0823 10/07/12 1130  GLUCAP 196* 140* 126* 143* 123*    Recent Results (from the past 240 hour(s))  MRSA PCR SCREENING     Status: None   Collection Time    09/28/12  3:51 PM      Result Value Range Status   MRSA by PCR NEGATIVE  NEGATIVE Final   Comment:            The GeneXpert MRSA Assay (FDA     approved for NASAL specimens  only), is one component of a     comprehensive MRSA colonization     surveillance program. It is not     intended to diagnose MRSA     infection nor to guide or     monitor treatment for     MRSA infections.  URINE CULTURE     Status: None   Collection Time    09/28/12  6:18 PM      Result Value Range Status   Specimen Description URINE, CLEAN CATCH   Final   Special Requests NONE   Final   Culture  Setup Time 09/28/2012 21:39   Final   Colony Count NO GROWTH   Final   Culture NO GROWTH   Final   Report Status 09/29/2012 FINAL   Final  CULTURE, BLOOD (ROUTINE X 2)     Status: None   Collection Time    09/28/12  9:15 PM      Result Value Range Status   Specimen Description BLOOD ARM LEFT   Final   Special Requests BOTTLES DRAWN AEROBIC AND ANAEROBIC 10CC   Final   Culture  Setup Time     Final   Value: 09/29/2012 03:33     Performed at [facility]   Culture     Final   Value: NO GROWTH 5 DAYS     Performed at [facility]   Report Status 10/05/2012 FINAL   Final  CULTURE, BLOOD (ROUTINE X 2)     Status: None   Collection Time    09/28/12  9:25 PM      Result Value Range Status   Specimen Description BLOOD HAND RIGHT   Final   Special Requests BOTTLES DRAWN AEROBIC ONLY 10CC   Final   Culture  Setup Time     Final   Value: 09/29/2012 03:33     Performed at [facility]   Culture     Final   Value: NO GROWTH 5 DAYS     Performed at [facility]   Report Status 10/05/2012 FINAL   Final  URINE CULTURE     Status: None   Collection Time    09/30/12 10:28 AM      Result Value  Range Status   Specimen Description URINE, RANDOM   Final   Special Requests NONE   Final   Culture  Setup Time 09/30/2012 11:35   Final   Colony Count NO GROWTH   Final   Culture NO GROWTH   Final   Report Status 10/01/2012 FINAL   Final  CULTURE, BLOOD (ROUTINE X 2)     Status: None   Collection Time    10/04/12  5:50 PM      Result Value Range Status   Specimen Description BLOOD RIGHT ARM   Final   Special Requests BOTTLES DRAWN AEROBIC AND ANAEROBIC 10CC   Final   Culture  Setup Time     Final   Value: 10/04/2012 22:52     Performed at [facility]   Culture     Final   Value:        BLOOD CULTURE RECEIVED NO GROWTH TO DATE CULTURE WILL BE HELD FOR 5 DAYS BEFORE ISSUING A FINAL NEGATIVE REPORT     Performed at [facility]   Report Status PENDING   Incomplete  CULTURE, BLOOD (ROUTINE X 2)     Status: None   Collection Time    10/04/12  5:57 PM      Result Value Range Status   Specimen Description BLOOD RIGHT ARM   Final   Special Requests BOTTLES DRAWN AEROBIC AND ANAEROBIC 10CC   Final   Culture  Setup Time     Final   Value: 10/04/2012 22:53     Performed at [facility]   Culture     Final   Value:        BLOOD CULTURE RECEIVED NO GROWTH TO DATE CULTURE WILL BE HELD FOR 5 DAYS BEFORE ISSUING A FINAL NEGATIVE REPORT     Performed at [facility]   Report Status PENDING   Incomplete  URINE CULTURE     Status: None   Collection Time    10/04/12  6:35 PM      Result Value Range Status   Specimen Description URINE, CLEAN CATCH   Final   Special Requests NONE   Final   Culture  Setup Time     Final   Value: 10/04/2012 20:43     Performed at [facility] Count     Final   Value: NO GROWTH     Performed at [facility]   Culture     Final   Value: NO GROWTH     Performed at [facility]   Report Status 10/06/2012 FINAL   Final     Studies: Dg Chest 2 View  10/05/2012   *RADIOLOGY REPORT*  Clinical  Data: Fever, shortness of breath, question pneumonia, history hypertension, diabetes, coronary artery disease, CHF  CHEST - 2 VIEW  Comparison: 09/28/2012  Findings: Enlargement of cardiac silhouette. Atherogenic calcification aorta. Mediastinal contours and pulmonary vascularity normal. Decreased lung volumes with minimal bibasilar atelectasis. No definite acute infiltrate, pleural effusion or pneumothorax. Bones demineralized.  IMPRESSION: Enlargement of cardiac silhouette. No definite acute abnormalities.   Original Report Authenticated By: Ulyses Southward, M.D.   Ct Head Wo Contrast  10/06/2012   *RADIOLOGY REPORT*  Clinical Data: Altered mental status.  Left-sided weakness. Diabetic hypertensive patient.  CT HEAD WITHOUT CONTRAST  Technique:  Contiguous axial images were obtained from the base of the skull through the vertex without contrast.  Comparison: 09/27/2012 brain MR. 09/26/2012 and 05/22/2008 head CT.  Findings: Motion degraded exam.  No intracranial hemorrhage.  Small vessel disease type changes without CT findings of large acute infarct.  Global atrophy.  Ventricular prominence may be related to atrophy although difficult to exclude a mild component hydrocephalus.  No intracranial mass lesion detected on this unenhanced exam.  Mastoid air cells, middle ear cavities and visualized paranasal sinuses are clear.  Orbital structures unremarkable.  IMPRESSION: Motion degraded exam.  No intracranial hemorrhage.  Small vessel disease type changes without CT findings of large acute infarct.  Global atrophy.  Ventricular prominence may be related to atrophy although difficult to exclude a mild component hydrocephalus.   Original Report Authenticated By: Lacy Duverney, M.D.    Scheduled Meds: . carvedilol  12.5 mg Oral BID  . [START ON 10/08/2012] enoxaparin (LOVENOX) injection  80 mg Subcutaneous Q12H  . feeding supplement  1 Container Oral TID BM  . insulin aspart  0-15 Units Subcutaneous TID WC  . insulin  aspart  0-5 Units Subcutaneous QHS  . insulin detemir  12 Units Subcutaneous QHS  . sodium chloride  3 mL Intravenous Q12H   Continuous Infusions: . 0.9 % NaCl with KCl 20 mEq / L 75 mL/hr at 10/07/12 8119  Time spent:25 minutes    Eddie North  [facility] Pager [phone] If 7PM-7AM, please contact night-coverage at [email or web address], password [facility] 10/07/2012, 2:37 PM  LOS: 9 days

## 2012-10-07 NOTE — Clinical Social Work Note (Signed)
CSW attempted to reach son [phone]) to discuss return to SNF at discharge and message left. CSW spoke with admissions director at [facility] skilled nursing facility and was advised that patient was admitted to them on 7/12 and readmitted to [facility] on 7/27. CSW will follow-up with son to confirm return to SNF at discharge.  Genelle Bal, MSW, LCSW [phone]

## 2012-10-07 NOTE — Progress Notes (Signed)
Speech Language Pathology Dysphagia Treatment Patient Details Name: Shaun Whitney MRN: [id number] DOB: [birth year] Today's Date: 10/07/2012 Time: 1000-1030 SLP Time Calculation (min): 30 min  Assessment / Plan / Recommendation Clinical Impression  F/u for diet tolerance and possible advancement.  Currently, patient on dysphagia 1 diet consistency and nectar thick liquids.  Patient indicated pain at location of left arm and wrist when repositioned.  RN aware.  Moderate amount of residue cleared from right buccal area from am meal prior to PO trials.  Administered ice chips x3 .  Moderate verbal and tactile cues required for patient to complete oral prep and oral phase mainly due to cognitive status.  No outward s/s of aspiration noted.   Continue current diet as judged safest at this time.  Caregivers and nursing demonstrated understanding of recommendations for total assist with all meals and to complete oral care before and after meals.  ST to continue in acute care setting for possible diet advancment .      Diet Recommendation  Continue with Current Diet: Dysphagia 1 (puree);Nectar-thick liquid    SLP Plan Continue with current plan of care      Swallowing Goals  SLP Swallowing Goals Swallow Study Goal #2 - Progress: Progressing toward goal  General Respiratory Status: Supplemental O2 delivered via (comment) (via nasal cannula ) Behavior/Cognition: Alert;Cooperative;Distractible;Requires cueing Oral Cavity - Dentition: Poor condition Patient Positioning: Upright in bed  Oral Cavity - Oral Hygiene Does patient have any of the following "at risk" factors?: Oxygen therapy - cannula, mask, simple oxygen devices;Tongue - coated;Mucous Membranes - reddened;Other - dysphagia Patient is HIGH RISK - Oral Care Protocol followed (see row info): Yes Patient is AT RISK - Oral Care Protocol followed (see row info): Yes   Dysphagia Treatment Treatment focused on: Skilled observation of diet  tolerance;Facilitation of oral phase;Facilitation of oral preparatory phase;Facilitation of pharyngeal phase;Upgraded PO texture trials Treatment Methods/Modalities: Skilled observation;Differential diagnosis Patient observed directly with PO's: Yes Type of PO's observed: Ice chips Feeding: Total assist Oral Phase Signs & Symptoms: Prolonged bolus formation;Prolonged oral phase Pharyngeal Phase Signs & Symptoms: Suspected delayed swallow initiation Type of cueing: Verbal;Tactile Amount of cueing: Moderate   GO    [name] MS, CCC-SLP [phone] [facility] 10/07/2012, 11:01 AM

## 2012-10-07 NOTE — Clinical Social Work Psychosocial (Signed)
Clinical Social Work Department BRIEF PSYCHOSOCIAL ASSESSMENT 10/07/2012  Patient:  Shaun Whitney, Shaun Whitney     Account Number:  [id number]     Admit date:  09/28/2012  Clinical Social Worker:  Delmer Islam  Date/Time:  10/07/2012 06:24 AM  Referred by:    Date Referred:  10/07/2012 Referred for  SNF Placement   Other Referral:   Interview type:  Family Other interview type:    PSYCHOSOCIAL DATA Living Status:  FACILITY Admitted from facility:  [facility] Level of care:  Skilled Nursing Facility Primary support name:  Shaun Whitney Primary support relationship to patient:  CHILD, ADULT Degree of support available:   Patient wife is Shaun Whitney. Scott's mobile [phone] and home [phone].    CURRENT CONCERNS Current Concerns  Post-Acute Placement   Other Concerns:    SOCIAL WORK ASSESSMENT / PLAN CSW talked with patient's son Shaun Whitney regarding discharge plans to determine if patient is returning to [facility] at discharge. Son stated that he did not know. When asked, he is not displeased with the care, but is thinking about patient going to a [facility] SNF. He is aware that [facility] is connected with [facility] and is interested in this facility, however Shaun Whitney plans to talk with his mother first and will let CSW know. CSW requested to know by Friday, as if patient is ready for discharge he will have to return to [facility] and son expressed understanding.   Assessment/plan status:  Psychosocial Support/Ongoing Assessment of Needs Other assessment/ plan:   Information/referral to community resources:    PATIENT'S/FAMILY'S RESPONSE TO PLAN OF CARE: Son receptive to talking with CSW and will talk with his mother regarding changing facilities and let CSW know.

## 2012-10-08 ENCOUNTER — Inpatient Hospital Stay (HOSPITAL_COMMUNITY): Payer: Medicare Other

## 2012-10-08 DIAGNOSIS — R509 Fever, unspecified: Secondary | ICD-10-CM | POA: Diagnosis not present

## 2012-10-08 LAB — CSF CELL COUNT WITH DIFFERENTIAL
RBC Count, CSF: 138 /mm3 — ABNORMAL HIGH
Tube #: 1

## 2012-10-08 LAB — GLUCOSE, CAPILLARY
Glucose-Capillary: 123 mg/dL — ABNORMAL HIGH (ref 70–99)
Glucose-Capillary: 118 mg/dL — ABNORMAL HIGH (ref 70–99)

## 2012-10-08 LAB — BASIC METABOLIC PANEL
BUN: 20 mg/dL (ref 6–23)
CO2: 24 mEq/L (ref 19–32)
Chloride: 106 mEq/L (ref 96–112)
Creatinine, Ser: 1.18 mg/dL (ref 0.50–1.35)

## 2012-10-08 LAB — CRYPTOCOCCAL ANTIGEN, CSF: Crypto Ag: NEGATIVE

## 2012-10-08 LAB — GRAM STAIN

## 2012-10-08 LAB — PROTEIN AND GLUCOSE, CSF: Glucose, CSF: 81 mg/dL — ABNORMAL HIGH (ref 43–76)

## 2012-10-08 LAB — THYROGLOBULIN ANTIBODY: Thyroglobulin Ab: <20 U/mL (ref <40.0)

## 2012-10-08 NOTE — Procedures (Signed)
ELECTROENCEPHALOGRAM REPORT   Patient: Shaun Whitney       Room #: 6E11 EEG No. ID: [id number] Age: 79 y.o.        Sex: male Referring Physician: Hongalgi Report Date:  10/08/2012        Interpreting Physician: Thana Farr D  History: Shaun Whitney is an 79 y.o. male with confusion and generalized weakness  Medications:  Scheduled: . carvedilol  12.5 mg Oral BID  . enoxaparin (LOVENOX) injection  80 mg Subcutaneous Q12H  . feeding supplement  1 Container Oral TID BM  . insulin aspart  0-15 Units Subcutaneous TID WC  . insulin aspart  0-5 Units Subcutaneous QHS  . insulin detemir  12 Units Subcutaneous QHS  . sodium chloride  3 mL Intravenous Q12H    Conditions of Recording:  This is a 16 channel EEG carried out with the patient in the awake and drowsy states.  Description:  During wakefulness the posterior background rhythm is poorly sustained.  At a maximum it reaches 8 Hz alpha.  For the majority of the tracing though the posterior background rhythm is actually much slower.  Theta rhythms are much more prominent.  The central and temporal regions are dominated by theta rhythms as well.  Occasionally during the tracing the background  Rhythm is slowed even further by a polymorphic delta activity that is most prominent in the frontal regions.   No Stage II sleep is present during the tracing.   Hyperventilation and intermittent photic stimulation were not performed.  IMPRESSION: This EEG is characterized by general background slowing and although this may be seen in normal drowse can not rule out a generalized cerebral disturbance that is etiologically nonspecific but may include a metabolic encephalopathy, among other possibilities.   Thana Farr, MD [facility] [phone] 10/08/2012, 6:38 PM

## 2012-10-08 NOTE — Progress Notes (Signed)
[facility] PROGRESS NOTE  Shaun Whitney ZOX:[id number] DOB: December 25, 1930 DOA: 09/28/2012 PCP: Kirstie Peri, MD  Brief narrative  79 year old male who was recently hospitalized at [facility] on 09/09/2012 for confusion and generalized weakness.During that admission, it was felt the patient may have been having focal seizures and he was started on Tegretol . He was discharged to a SNF. Two weeks later patient developed increasing lethargy and poor oral intake. He was readmitted to [facility] on 09/26/2012. 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 He was found to be in acute renal failure with hypernatremia and was hydrated. He remained confused and encephalopathic. Family spoke with the patient's primary care physician and requested the patient be transferred to [facility].   Assessment/Plan:   Acute toxic metabolic encephalopathy  Initially thought due to hypernatremia and fever with UTI  -prior MRI reported as negative  -EEG suggests encephalopathy, no evidence of seizure activity or foci  -Patient continues to be encephalopathic. Also had a fever on 8/4. Fever workup so far negative. He does seem to have some left-sided weakness on exam however difficult to assess given his encephalopathy. I repeated a head CT which is unremarkable except for chronic small vessel disease. TSH, RPR, B12 wnl Appreciate neurology reccommended. No clear etiology.  - Repeat MRI brain and C-spine without acute findings. A repeat EEG results pending. - Awaiting LP by interventional radiology.  Left wrist drop - Noted on exam 8/70.  Unclear of duration of this symptoms. Has tenderness to palpation of left wrist. Will check uric acid. ordered MRI c spine. - Uric acid: 2.9. - MRI C-spine shows advanced cervical spine degenerative changes, mild cervical spinal stenosis at 2-3, otherwise moderate to severe foramina stenosis. Not sure if these are contributing to the wrist drop-neurology  to follow.  Fever/leukocytosis  -UA positive but urine culture negative  abx discontinued ( zosyn) but was resumed on 8/4 along with vanco as patient became febrile again to 102.33F. Blood cx negative. CXR negative for infiltrate  No clear source of infection. Antibiotics discontinued. - ? Fever secondary to DVT.  Dehydration with hypernatremia  -resolved with IV fluids  -was on Lasix prior to admission   LLE edema/acute DVT  -venous duplex shows extensive DVT LLE  --on full dose Lovenox since 7/31 . Will hold dose as planned for LP in am. -switch to Xarelto when taking po well .   Acute renal failure on CKD (chronic kidney disease) stage 3, GFR 30-59 ml/min  -baseline Scr 1.18 with GFR around 60  -likely due to dehydration - granular cast in UA suggest ATN as well  -[place] normal   Chronic diastolic congestive heart failure  -Follows with [place] [facility]  -euvolemic at present   Hypokalemia  -relpenished   Stage 2 sacral decubitus  -appreciate Wound RN eval  -  Mild Dysphagia  -SLP eval rec D1 diet w/ nectar liquids   DM -Last A1c 6.5  -fsg stable   continue levemir and SSI  HTN  -improved  -continue to hold ACE inhibitor and Lasix for now   CAD (coronary artery disease)  -Stable   Thrombocytopenia, unspecified  resolved   Anemia  -hgb stable   ?Seizure disorder  -Recently started on Tegretol for suspected focal seizures - has been on hold due to lethargy  -EEG without evidence of seizure . Repeat EEG ordered & pending    DVT prophylaxis: full dose Lovenox  Code Status: Full  Family Communication: None  today. Disposition Plan: SNF once improved   Consultants:  Neurology  Interventional radiology.  Procedures:  EEG  LLE doppler LP planned for 8/8  Antibiotics:  Zosyn 7/29 >> 8/1  8/4 >> 8/7 Vancomycin 8/4>> 8/7  HPI/Subjective:  Patient denies complaints. Minimal history from patient.  Objective: Filed Vitals:   10/08/12 1422  BP:  168/95  Pulse: 97  Temp: 98.9 F (37.2 C)  Resp: 17    Intake/Output Summary (Last 24 hours) at 10/08/12 1450 Last data filed at 10/08/12 0508  Gross per 24 hour  Intake      0 ml  Output   1350 ml  Net  -1350 ml   Filed Weights   10/05/12 2048 10/06/12 2145 10/07/12 2308  Weight: 85.9 kg (189 lb 6 oz) 85.75 kg (189 lb 0.7 oz) 87.5 kg (192 lb 14.4 oz)    Exam:   General: elderly frail male in NAD  HEENT: no pallor, moist mucosa  CHEST: Clear to auscultation bilaterally. No increased work of breathing.  Cardiovascular: Regular rate and rhythm No murmur gallop or rub normal. Telemetry: Sinus rhythm.  Abdomen: soft, Nontender, nondistended, soft, bowel sounds positive,  Musculoskeletal: warm, swelling over left leg with 2+ pitting edema  Neurological: Awake and occasionally follows few simple commands,Not oriented-will occasionally answer to questions in one-word . Left wrist drop   Data Reviewed: Basic Metabolic Panel:  Recent Labs Lab 10/01/12 1814  10/03/12 0435 10/05/12 0500 10/06/12 0500 10/07/12 1456 10/08/12 0500  NA  --   < > 142 144 145 142 142  K  --   < > 2.6* 3.9 4.3 3.9 3.9  CL  --   < > 108 110 113* 107 106  CO2  --   < > 24 24 23 22 24   GLUCOSE  --   < > 185* 229* 187* 147* 153*  BUN  --   < > 14 20 24* 19 20  CREATININE  --   < > 1.29 1.42* 1.46* 1.21 1.18  CALCIUM  --   < > 8.6 8.5 8.7 8.8 8.9  MG 2.0  --  1.9  --   --   --   --   < > = values in this interval not displayed. Liver Function Tests: No results found for this basename: AST, ALT, ALKPHOS, BILITOT, PROT, ALBUMIN,  in the last 168 hours No results found for this basename: LIPASE, AMYLASE,  in the last 168 hours No results found for this basename: AMMONIA,  in the last 168 hours CBC:  Recent Labs Lab 10/01/12 1814 10/02/12 0908 10/04/12 0510 10/05/12 0500 10/07/12 0400  WBC 14.4* 11.5* 10.4 10.5 9.3  HGB 9.6* 9.3* 10.7* 9.4* 9.5*  HCT 27.8* 27.4* 30.9* 27.4* 27.7*  MCV 98.6  98.6 97.2 98.6 98.6  PLT 154 167 258 261 328   Cardiac Enzymes: No results found for this basename: CKTOTAL, CKMB, CKMBINDEX, TROPONINI,  in the last 168 hours BNP (last 3 results) No results found for this basename: PROBNP,  in the last 8760 hours CBG:  Recent Labs Lab 10/07/12 1130 10/07/12 1819 10/07/12 2306 10/08/12 0906 10/08/12 1141  GLUCAP 123* 134* 136* 147* 123*    Recent Results (from the past 240 hour(s))  MRSA PCR SCREENING     Status: None   Collection Time    09/28/12  3:51 PM      Result Value Range Status   MRSA by PCR NEGATIVE  NEGATIVE Final   Comment:  The GeneXpert MRSA Assay (FDA     approved for NASAL specimens     only), is one component of a     comprehensive MRSA colonization     surveillance program. It is not     intended to diagnose MRSA     infection nor to guide or     monitor treatment for     MRSA infections.  URINE CULTURE     Status: None   Collection Time    09/28/12  6:18 PM      Result Value Range Status   Specimen Description URINE, CLEAN CATCH   Final   Special Requests NONE   Final   Culture  Setup Time 09/28/2012 21:39   Final   Colony Count NO GROWTH   Final   Culture NO GROWTH   Final   Report Status 09/29/2012 FINAL   Final  CULTURE, BLOOD (ROUTINE X 2)     Status: None   Collection Time    09/28/12  9:15 PM      Result Value Range Status   Specimen Description BLOOD ARM LEFT   Final   Special Requests BOTTLES DRAWN AEROBIC AND ANAEROBIC 10CC   Final   Culture  Setup Time     Final   Value: 09/29/2012 03:33     Performed at [facility]   Culture     Final   Value: NO GROWTH 5 DAYS     Performed at [facility]   Report Status 10/05/2012 FINAL   Final  CULTURE, BLOOD (ROUTINE X 2)     Status: None   Collection Time    09/28/12  9:25 PM      Result Value Range Status   Specimen Description BLOOD HAND RIGHT   Final   Special Requests BOTTLES DRAWN AEROBIC ONLY 10CC   Final   Culture   Setup Time     Final   Value: 09/29/2012 03:33     Performed at [facility]   Culture     Final   Value: NO GROWTH 5 DAYS     Performed at [facility]   Report Status 10/05/2012 FINAL   Final  URINE CULTURE     Status: None   Collection Time    09/30/12 10:28 AM      Result Value Range Status   Specimen Description URINE, RANDOM   Final   Special Requests NONE   Final   Culture  Setup Time 09/30/2012 11:35   Final   Colony Count NO GROWTH   Final   Culture NO GROWTH   Final   Report Status 10/01/2012 FINAL   Final  CULTURE, BLOOD (ROUTINE X 2)     Status: None   Collection Time    10/04/12  5:50 PM      Result Value Range Status   Specimen Description BLOOD RIGHT ARM   Final   Special Requests BOTTLES DRAWN AEROBIC AND ANAEROBIC 10CC   Final   Culture  Setup Time     Final   Value: 10/04/2012 22:52     Performed at [facility]   Culture     Final   Value:        BLOOD CULTURE RECEIVED NO GROWTH TO DATE CULTURE WILL BE HELD FOR 5 DAYS BEFORE ISSUING A FINAL NEGATIVE REPORT     Performed at [facility]   Report Status PENDING   Incomplete  CULTURE, BLOOD (ROUTINE  X 2)     Status: None   Collection Time    10/04/12  5:57 PM      Result Value Range Status   Specimen Description BLOOD RIGHT ARM   Final   Special Requests BOTTLES DRAWN AEROBIC AND ANAEROBIC 10CC   Final   Culture  Setup Time     Final   Value: 10/04/2012 22:53     Performed at [facility]   Culture     Final   Value:        BLOOD CULTURE RECEIVED NO GROWTH TO DATE CULTURE WILL BE HELD FOR 5 DAYS BEFORE ISSUING A FINAL NEGATIVE REPORT     Performed at [facility]   Report Status PENDING   Incomplete  URINE CULTURE     Status: None   Collection Time    10/04/12  6:35 PM      Result Value Range Status   Specimen Description URINE, CLEAN CATCH   Final   Special Requests NONE   Final   Culture  Setup Time     Final   Value: 10/04/2012 20:43     Performed at  [facility] Count     Final   Value: NO GROWTH     Performed at [facility]   Culture     Final   Value: NO GROWTH     Performed at [facility]   Report Status 10/06/2012 FINAL   Final     Studies: Mr Brain Wo Contrast  10/07/2012   *RADIOLOGY REPORT*  Clinical Data:  79 year old male with altered mental status.  Left hand swelling.  Left wrist drop.  MRI HEAD WITHOUT CONTRAST MRI CERVICAL SPINE WITHOUT CONTRAST  Technique:  Multiplanar, multiecho pulse sequences of the brain and surrounding structures, and cervical spine, to include the craniocervical junction and cervicothoracic junction, were obtained without intravenous contrast.  Comparison:  [facility] brain MRI 09/27/2012 and earlier.  Head CT 10/06/2012.  MRI HEAD  Findings:  Stable cerebral volume. No restricted diffusion to suggest acute infarction.  No midline shift, mass effect, evidence of mass lesion, ventriculomegaly, extra-axial collection or acute intracranial hemorrhage.  Cervicomedullary junction and pituitary are within normal limits.  Major intracranial vascular flow voids are stable.  Mild T2 heterogeneity in the deep gray matter nuclei, not significantly changed.  Favor small vessel ischemia related. Mild for age cerebral white matter T2 and FLAIR hyperintensity was better demonstrated on previous studies with left motion.  No acute signal abnormality identified in the brain.  Decreased bone marrow signal in the visualized cervical spine of unclear etiology and significance.  Grossly negative visualized cervical spinal cord.  Bone marrow heterogeneity also in the clivus which has increased from earlier this year.  No destructive osseous lesion identified.  Stable orbit soft tissues, paranasal sinuses, mastoids, and scalp soft tissues.  IMPRESSION: 1. No acute intracranial abnormality.  Stable noncontrast MRI appearance of the brain. 2.  Loss of normal bone marrow signal at the  skull base and cervical spine, appears new or progressed since earlier this year. Query anemia or other benign etiology.  If the patient has a known malignancy metastatic disease to bone is not excluded. 3.  Additional cervical spine findings below.  MRI CERVICAL SPINE  Findings: Study is intermittently degraded by motion artifact despite repeated imaging attempts.  Mild reversal of cervical lordosis. No marrow edema or evidence of acute osseous abnormality.  There is mild  increased signal in the C4-C5 disc space, but also some evidence of interbody ankylosis at this level.  Favor this signal changes degenerative.  There is fluid in both sternoclavicular joints (series 14 image 12).  No definite associated bone marrow edema in the adjacent manubrium or medial clavicles.  Visualized paraspinal soft tissues are within normal limits.  C2-C3:  Moderate facet hypertrophy.  Mild disc bulge.  Ligament flavum hypertrophy.  Borderline spinal stenosis without spinal cord mass effect.  Moderate to severe bilateral C3 foraminal stenosis.  C3-C4:  Severe facet hypertrophy on the left.  Moderate facet hypertrophy on the right.  Negative disc and no spinal stenosis. Severe left greater than right C4 foraminal stenosis.  C4-C5:  Severe facet hypertrophy on the right.  No spinal stenosis. Moderate right C5 foraminal stenosis.  C5-C6:  Bulky mostly anterior eccentric circumferential disc osteophyte complex.  Bulky uncovertebral hypertrophy.  Moderate facet hypertrophy.  No significant spinal stenosis.  Severe right and moderate to severe left C6 foraminal stenosis.  C6-C7:  Bulky mostly right and anterior eccentric circumferential disc osteophyte complex.  No spinal stenosis.  Mild left C7 foraminal stenosis.  C7-T1:  Moderate facet hypertrophy.  Mild uncovertebral hypertrophy greater on the left.  Moderate left C8 foraminal stenosis.  No upper thoracic spinal stenosis identified.  IMPRESSION: 1.  No acute osseous abnormality  identified in the cervical spine. There is bilateral sternoclavicular joint fluid, favor degenerative without strong evidence of sternoclavicular osteomyelitis at this time. 2.  Advanced cervical spine degenerative changes, especially facet arthropathy.  There is borderline to mild cervical spinal stenosis at C2-C3, otherwise moderate to severe foraminal stenosis is present (severe at most cervical neural foramen). 3.  Bulky lower cervical endplate osteophytosis, but primarily anteriorly directed, without associated cervical spinal stenosis.   Original Report Authenticated By: Erskine Speed, M.D.   Mr Cervical Spine Wo Contrast  10/07/2012   *RADIOLOGY REPORT*  Clinical Data:  79 year old male with altered mental status.  Left hand swelling.  Left wrist drop.  MRI HEAD WITHOUT CONTRAST MRI CERVICAL SPINE WITHOUT CONTRAST  Technique:  Multiplanar, multiecho pulse sequences of the brain and surrounding structures, and cervical spine, to include the craniocervical junction and cervicothoracic junction, were obtained without intravenous contrast.  Comparison:  [facility] brain MRI 09/27/2012 and earlier.  Head CT 10/06/2012.  MRI HEAD  Findings:  Stable cerebral volume. No restricted diffusion to suggest acute infarction.  No midline shift, mass effect, evidence of mass lesion, ventriculomegaly, extra-axial collection or acute intracranial hemorrhage.  Cervicomedullary junction and pituitary are within normal limits.  Major intracranial vascular flow voids are stable.  Mild T2 heterogeneity in the deep gray matter nuclei, not significantly changed.  Favor small vessel ischemia related. Mild for age cerebral white matter T2 and FLAIR hyperintensity was better demonstrated on previous studies with left motion.  No acute signal abnormality identified in the brain.  Decreased bone marrow signal in the visualized cervical spine of unclear etiology and significance.  Grossly negative visualized cervical spinal  cord.  Bone marrow heterogeneity also in the clivus which has increased from earlier this year.  No destructive osseous lesion identified.  Stable orbit soft tissues, paranasal sinuses, mastoids, and scalp soft tissues.  IMPRESSION: 1. No acute intracranial abnormality.  Stable noncontrast MRI appearance of the brain. 2.  Loss of normal bone marrow signal at the skull base and cervical spine, appears new or progressed since earlier this year. Query anemia or other benign etiology.  If the  patient has a known malignancy metastatic disease to bone is not excluded. 3.  Additional cervical spine findings below.  MRI CERVICAL SPINE  Findings: Study is intermittently degraded by motion artifact despite repeated imaging attempts.  Mild reversal of cervical lordosis. No marrow edema or evidence of acute osseous abnormality.  There is mild increased signal in the C4-C5 disc space, but also some evidence of interbody ankylosis at this level.  Favor this signal changes degenerative.  There is fluid in both sternoclavicular joints (series 14 image 12).  No definite associated bone marrow edema in the adjacent manubrium or medial clavicles.  Visualized paraspinal soft tissues are within normal limits.  C2-C3:  Moderate facet hypertrophy.  Mild disc bulge.  Ligament flavum hypertrophy.  Borderline spinal stenosis without spinal cord mass effect.  Moderate to severe bilateral C3 foraminal stenosis.  C3-C4:  Severe facet hypertrophy on the left.  Moderate facet hypertrophy on the right.  Negative disc and no spinal stenosis. Severe left greater than right C4 foraminal stenosis.  C4-C5:  Severe facet hypertrophy on the right.  No spinal stenosis. Moderate right C5 foraminal stenosis.  C5-C6:  Bulky mostly anterior eccentric circumferential disc osteophyte complex.  Bulky uncovertebral hypertrophy.  Moderate facet hypertrophy.  No significant spinal stenosis.  Severe right and moderate to severe left C6 foraminal stenosis.  C6-C7:   Bulky mostly right and anterior eccentric circumferential disc osteophyte complex.  No spinal stenosis.  Mild left C7 foraminal stenosis.  C7-T1:  Moderate facet hypertrophy.  Mild uncovertebral hypertrophy greater on the left.  Moderate left C8 foraminal stenosis.  No upper thoracic spinal stenosis identified.  IMPRESSION: 1.  No acute osseous abnormality identified in the cervical spine. There is bilateral sternoclavicular joint fluid, favor degenerative without strong evidence of sternoclavicular osteomyelitis at this time. 2.  Advanced cervical spine degenerative changes, especially facet arthropathy.  There is borderline to mild cervical spinal stenosis at C2-C3, otherwise moderate to severe foraminal stenosis is present (severe at most cervical neural foramen). 3.  Bulky lower cervical endplate osteophytosis, but primarily anteriorly directed, without associated cervical spinal stenosis.   Original Report Authenticated By: Erskine Speed, M.D.    Scheduled Meds: . carvedilol  12.5 mg Oral BID  . enoxaparin (LOVENOX) injection  80 mg Subcutaneous Q12H  . feeding supplement  1 Container Oral TID BM  . insulin aspart  0-15 Units Subcutaneous TID WC  . insulin aspart  0-5 Units Subcutaneous QHS  . insulin detemir  12 Units Subcutaneous QHS  . sodium chloride  3 mL Intravenous Q12H   Continuous Infusions: . 0.9 % NaCl with KCl 20 mEq / L 75 mL/hr at 10/08/12 1036      Time spent:25 minutes    [facility]  [facility] Pager [phone] If 7PM-7AM, please contact night-coverage at [email or web address], password [facility] 10/08/2012, 2:50 PM  LOS: 10 days

## 2012-10-08 NOTE — Progress Notes (Signed)
EEG Completed; Results Pending  

## 2012-10-08 NOTE — Progress Notes (Signed)
Physical Therapy Treatment Patient Details Name: Shaun Whitney MRN: [id number] DOB: Dec 02, 1930 Today's Date: 10/08/2012 Time: 1019-1040 PT Time Calculation (min): 21 min  PT Assessment / Plan / Recommendation  History of Present Illness Pt admit with acute encephalopathy.   PT Comments   Pt with decline in mental status since this PT last saw him.  Pt with no participation in mobility and not following any directions.    Follow Up Recommendations  SNF;Supervision/Assistance - 24 hour     Does the patient have the potential to tolerate intense rehabilitation     Barriers to Discharge        Equipment Recommendations  None recommended by PT    Recommendations for Other Services    Frequency Min 2X/week   Progress towards PT Goals Progress towards PT goals: Not progressing toward goals - comment (Decline in mental status.  )  Plan Current plan remains appropriate    Precautions / Restrictions Precautions Precautions: Fall Restrictions Weight Bearing Restrictions: No   Pertinent Vitals/Pain Groaned during bed mobility.  RN aware.      Mobility  Bed Mobility Bed Mobility: Supine to Sit;Sitting - Scoot to Edge of Bed;Sit to Supine Supine to Sit: 1: +2 Total assist Supine to Sit: Patient Percentage: 0% Sitting - Scoot to Edge of Bed: 1: +2 Total assist Sitting - Scoot to Edge of Bed: Patient Percentage: 0% Sit to Supine: 1: +2 Total assist Sit to Supine: Patient Percentage: 0% Details for Bed Mobility Assistance: pt with no attempt at participation or mobility today.   Transfers Transfers: Not assessed Ambulation/Gait Ambulation/Gait Assistance: Not tested (comment) Stairs: No Wheelchair Mobility Wheelchair Mobility: No    Exercises     PT Diagnosis:    PT Problem List:   PT Treatment Interventions:     PT Goals (current goals can now be found in the care plan section) Acute Rehab PT Goals Time For Goal Achievement: 10/13/12 Potential to Achieve Goals:  Fair  Visit Information  Last PT Received On: 10/08/12 Assistance Needed: +2 History of Present Illness: Pt admit with acute encephalopathy.    Subjective Data  Subjective: Even less verbalizations today.     Cognition  Cognition Arousal/Alertness: Awake/alert Behavior During Therapy: Flat affect Overall Cognitive Status: Impaired/Different from baseline Area of Impairment: Attention;Orientation;Memory;Following commands;Safety/judgement;Awareness;Problem solving General Comments: Difficult to assess 2/2 no verbalizations and not following directions.      Balance  Balance Balance Assessed: Yes Static Sitting Balance Static Sitting - Balance Support: Bilateral upper extremity supported;Feet supported Static Sitting - Level of Assistance: 1: +2 Total assist Static Sitting - Comment/# of Minutes: Today pt 0% to maintain sitting balance.  pt leans posteriorly and even with cueing and facilitation, is unable to A with balance.    End of Session PT - End of Session Activity Tolerance: Patient limited by fatigue (Limited by cognition) Patient left: in bed;with call bell/phone within reach;with bed alarm set Nurse Communication: Mobility status;Need for lift equipment   GP     Sunny Schlein, [place] [phone] 10/08/2012, 12:02 PM

## 2012-10-08 NOTE — Progress Notes (Signed)
Speech Language Pathology Dysphagia Treatment Patient Details Name: Shaun Whitney MRN: [id number] DOB: [birth year] Today's Date: 10/08/2012 Time: 0435-0502 SLP Time Calculation (min): 27 min  Assessment / Plan / Recommendation Clinical Impression  Clinician provided thorough oral care followed by trials of small sips of water 3x with delayed throat clear 1x, cued to assess vocal quality however pt unable to elicit. Provided trial of mech soft solid, pt with prolonged mastication and prolonged formation of bolus, clinician provided small sip of nectar thick liquid via cup to assist with clearing oral residuals. Pt with oral holding of nectar thick liquid, cued to swallow successfully 1x, otherwise required suction 2x. RN reports pt holding meds in puree. Recommend oral care prior to and after po intake and oral med intake. F/u for diet tolerance and possible advancement, recommend cont current diet of dys 1 (puree) with nectar thick liquids.     Diet Recommendation  Continue with Current Diet: Dysphagia 1 (puree);Nectar-thick liquid    SLP Plan Continue with current plan of care   Pertinent Vitals/Pain Grimaced indicating pain   Swallowing Goals  SLP Swallowing Goals Swallow Study Goal #1 - Progress: Progressing toward goal Swallow Study Goal #2 - Progress: Progressing toward goal  General Temperature Spikes Noted: No Respiratory Status: Supplemental O2 delivered via (comment) Behavior/Cognition: Alert;Cooperative;Distractible;Requires cueing Oral Cavity - Dentition: Poor condition Patient Positioning: Upright in bed  Oral Cavity - Oral Hygiene Does patient have any of the following "at risk" factors?: Other - dysphagia;Diet - patient on thickened liquids;Lips - dry, cracked;Oxygen therapy - cannula, mask, simple oxygen devices;Tongue - coated;Mucous Membranes - reddened Patient is HIGH RISK - Oral Care Protocol followed (see row info): Yes Patient is AT RISK - Oral Care Protocol followed  (see row info): Yes   Dysphagia Treatment Treatment focused on: Skilled observation of diet tolerance;Facilitation of oral phase;Facilitation of oral preparatory phase;Facilitation of pharyngeal phase;Upgraded PO texture trials Treatment Methods/Modalities: Skilled observation;Differential diagnosis Patient observed directly with PO's: Yes Type of PO's observed: Dysphagia 3 (soft);Thin liquids;Nectar-thick liquids Feeding: Able to feed self;Needs assist Liquids provided via: Cup Oral Phase Signs & Symptoms: Prolonged mastication;Prolonged bolus formation;Prolonged oral phase Pharyngeal Phase Signs & Symptoms: Suspected delayed swallow initiation Type of cueing: Verbal;Tactile Amount of cueing: Moderate   GO     Lyanne Co MA, CCC-SLP 10/08/2012, 5:15 PM

## 2012-10-09 MED ORDER — BISACODYL 10 MG RE SUPP: 10.0000 mg | Freq: Every day | RECTAL | Status: DC | PRN

## 2012-10-09 MED ORDER — SENNA 8.6 MG PO TABS
2.0000 | ORAL_TABLET | Freq: Every day | ORAL | Status: DC
  Administered 2012-10-09 – 2012-10-15 (×7): 17.2 mg via ORAL
  Filled 2012-10-09 (×8): qty 2

## 2012-10-09 NOTE — Progress Notes (Signed)
Weekend CSW spoke with patient's son, Lorin Picket, regarding his father's placement for PT after d/c. Lorin Picket stated that his preference is [facility], but returning to [facility] as the back up plan. CSW informed patient's son Lorin Picket that CSW could fax out info for a bed offer and he agreed. Weekday CSW to follow for discharge planning.  Belenda Cruise Drinkard, LCSWA

## 2012-10-09 NOTE — Progress Notes (Addendum)
[facility] PROGRESS NOTE  Shaun Whitney RUE:[id number] DOB: Feb 20, 1931 DOA: 09/28/2012 PCP: Shaun Peri, MD  Brief narrative  79 year old male who was recently hospitalized at [facility] on 09/09/2012 for confusion and generalized weakness.During that admission, it was felt the patient may have been having focal seizures and he was started on Tegretol . He was discharged to a SNF. Two weeks later patient developed increasing lethargy and poor oral intake. He was readmitted to [facility] on 09/26/2012. 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 He was found to be in acute renal failure with hypernatremia and was hydrated. He remained confused and encephalopathic. Family spoke with the patient's primary care physician and requested the patient be transferred to [facility].   Assessment/Plan:   Acute toxic metabolic encephalopathy  - Initially thought due to hypernatremia and fever with UTI  - prior MRI reported as negative  - EEG suggests encephalopathy, no evidence of seizure activity or foci  - Patient continues to be encephalopathic. Also had a fever on 8/4. Fever workup so far negative. He does seem to have some left-sided weakness on exam however difficult to assess given his encephalopathy. Repeat head CT which is unremarkable except for chronic small vessel disease. TSH, RPR, B12 wnl - Appreciate neurology follow up. No clear etiology.  - Repeat MRI brain and C-spine without acute findings.  - Repeat EEG does not show seizure like activity. Gen. background slowing. - Had LP 8/8:1 WBC, normal protein and cryptococcal antigen negative. CSF culture, fungal culture, VDRL, B. Burgdorfi antibodies, HSV, IgG index, oligoclonal bands, beta isoform are in process. - Requested neurology to followup on 8/9   Left wrist drop - Noted on exam 8/7.  Unclear of duration of this symptoms. Has tenderness to palpation of left wrist-improved.  - Uric acid: 2.9. - MRI  C-spine shows advanced cervical spine degenerative changes, mild cervical spinal stenosis at 2-3, otherwise moderate to severe foramina stenosis. Not sure if these are contributing to the wrist drop-neurology to follow.  Fever/leukocytosis  -UA positive but urine culture negative  - abx discontinued ( zosyn) but was resumed on 8/4 along with vanco as patient became febrile again to 102.65F. Blood cx negative. CXR negative for infiltrate  - No clear source of infection. Antibiotics discontinued. - ? Fever secondary to DVT.  Dehydration with hypernatremia  -resolved with IV fluids  -was on Lasix prior to admission   LLE edema/acute DVT  - venous duplex shows extensive DVT LLE  - on full dose Lovenox since 7/31 .  -switch to Xarelto when taking po well .   Acute renal failure on CKD (chronic kidney disease) stage 3, GFR 30-59 ml/min  -baseline Scr 1.18 with GFR around 60  -likely due to dehydration - granular cast in UA suggest ATN as well  -[place] normal  - Resolved.  Chronic diastolic congestive heart failure  -Follows with [facility]  -euvolemic at present  - Clinically compensated.  Hypokalemia  -relpenished   Stage 2 sacral decubitus  -appreciate Wound RN eval  -  Mild Dysphagia  -SLP eval rec D1 diet w/ nectar liquids   DM -Last A1c 6.5  -fsg stable   continue levemir and SSI - Reasonable inpatient control.  HTN  -improved  -continue to hold ACE inhibitor and Lasix for now   CAD (coronary artery disease)  -Stable   Thrombocytopenia, unspecified  resolved   Anemia  -hgb stable   ?Seizure disorder  -Recently started on  Tegretol for suspected focal seizures - has been on hold due to lethargy  -EEG without evidence of seizure . Repeat EEG without seizure like activity.    DVT prophylaxis: full dose Lovenox  Code Status: Full  Family Communication: D/W son Mr. Shaun Whitney. Disposition Plan: SNF once improved   Consultants:  Neurology   Interventional radiology.  Procedures:  EEG  LLE doppler LP 8/8  Antibiotics:  Zosyn 7/29 >> 8/1  8/4 >> 8/7 Vancomycin 8/4>> 8/7  HPI/Subjective:  Patient denies complaints. Minimal history from patient. No acute events per nursing.  Objective: Filed Vitals:   10/09/12 1014  BP: 151/70  Pulse: 73  Temp: 97.8 F (36.6 C)  Resp: 18    Intake/Output Summary (Last 24 hours) at 10/09/12 1202 Last data filed at 10/09/12 1610  Gross per 24 hour  Intake     80 ml  Output   1750 ml  Net  -1670 ml   Filed Weights   10/06/12 2145 10/07/12 2308 10/08/12 2137  Weight: 85.75 kg (189 lb 0.7 oz) 87.5 kg (192 lb 14.4 oz) 83.054 kg (183 lb 1.6 oz)    Exam:   General: elderly frail male in NAD  HEENT: no pallor, moist mucosa  CHEST: Clear to auscultation bilaterally. No increased work of breathing.  Cardiovascular: Regular rate and rhythm No murmur gallop or rub normal. Telemetry: Sinus rhythm.  Abdomen: soft, Nontender, nondistended, soft, bowel sounds positive,  Musculoskeletal: warm, swelling over left leg with 2+ pitting edema  Neurological: Awake and occasionally follows few simple commands,Not oriented-will occasionally answer to questions in one-word . Left wrist drop   Data Reviewed: Basic Metabolic Panel:  Recent Labs Lab 10/03/12 0435 10/05/12 0500 10/06/12 0500 10/07/12 1456 10/08/12 0500  NA 142 144 145 142 142  K 2.6* 3.9 4.3 3.9 3.9  CL 108 110 113* 107 106  CO2 24 24 23 22 24   GLUCOSE 185* 229* 187* 147* 153*  BUN 14 20 24* 19 20  CREATININE 1.29 1.42* 1.46* 1.21 1.18  CALCIUM 8.6 8.5 8.7 8.8 8.9  MG 1.9  --   --   --   --    Liver Function Tests: No results found for this basename: AST, ALT, ALKPHOS, BILITOT, PROT, ALBUMIN,  in the last 168 hours No results found for this basename: LIPASE, AMYLASE,  in the last 168 hours No results found for this basename: AMMONIA,  in the last 168 hours CBC:  Recent Labs Lab 10/04/12 0510 10/05/12 0500  10/07/12 0400  WBC 10.4 10.5 9.3  HGB 10.7* 9.4* 9.5*  HCT 30.9* 27.4* 27.7*  MCV 97.2 98.6 98.6  PLT 258 261 328   Cardiac Enzymes: No results found for this basename: CKTOTAL, CKMB, CKMBINDEX, TROPONINI,  in the last 168 hours BNP (last 3 results) No results found for this basename: PROBNP,  in the last 8760 hours CBG:  Recent Labs Lab 10/08/12 0906 10/08/12 1141 10/08/12 1650 10/08/12 2144 10/09/12 0741  GLUCAP 147* 123* 118* 150* 101*    Recent Results (from the past 240 hour(s))  URINE CULTURE     Status: None   Collection Time    09/30/12 10:28 AM      Result Value Range Status   Specimen Description URINE, RANDOM   Final   Special Requests NONE   Final   Culture  Setup Time 09/30/2012 11:35   Final   Colony Count NO GROWTH   Final   Culture NO GROWTH   Final  Report Status 10/01/2012 FINAL   Final  CULTURE, BLOOD (ROUTINE X 2)     Status: None   Collection Time    10/04/12  5:50 PM      Result Value Range Status   Specimen Description BLOOD RIGHT ARM   Final   Special Requests BOTTLES DRAWN AEROBIC AND ANAEROBIC 10CC   Final   Culture  Setup Time     Final   Value: 10/04/2012 22:52     Performed at [facility]   Culture     Final   Value:        BLOOD CULTURE RECEIVED NO GROWTH TO DATE CULTURE WILL BE HELD FOR 5 DAYS BEFORE ISSUING A FINAL NEGATIVE REPORT     Performed at [facility]   Report Status PENDING   Incomplete  CULTURE, BLOOD (ROUTINE X 2)     Status: None   Collection Time    10/04/12  5:57 PM      Result Value Range Status   Specimen Description BLOOD RIGHT ARM   Final   Special Requests BOTTLES DRAWN AEROBIC AND ANAEROBIC 10CC   Final   Culture  Setup Time     Final   Value: 10/04/2012 22:53     Performed at [facility]   Culture     Final   Value:        BLOOD CULTURE RECEIVED NO GROWTH TO DATE CULTURE WILL BE HELD FOR 5 DAYS BEFORE ISSUING A FINAL NEGATIVE REPORT     Performed at [facility]    Report Status PENDING   Incomplete  URINE CULTURE     Status: None   Collection Time    10/04/12  6:35 PM      Result Value Range Status   Specimen Description URINE, CLEAN CATCH   Final   Special Requests NONE   Final   Culture  Setup Time     Final   Value: 10/04/2012 20:43     Performed at [facility] Count     Final   Value: NO GROWTH     Performed at [facility]   Culture     Final   Value: NO GROWTH     Performed at [facility]   Report Status 10/06/2012 FINAL   Final  CSF CULTURE     Status: None   Collection Time    10/08/12  2:36 PM      Result Value Range Status   Specimen Description CSF   Final   Special Requests 5.5ML CSF FLUID   Final   Gram Stain     Final   Value: RARE WBC PRESENT, PREDOMINANTLY MONONUCLEAR     NO ORGANISMS SEEN     Performed at [facility]     Performed at [facility]   Culture PENDING   Incomplete   Report Status PENDING   Incomplete  GRAM STAIN     Status: None   Collection Time    10/08/12  2:36 PM      Result Value Range Status   Specimen Description CSF   Final   Special Requests 5.5ML CSF FLUID   Final   Gram Stain     Final   Value: CYTOSPIN PREP     WBC PRESENT, PREDOMINANTLY MONONUCLEAR     NO ORGANISMS SEEN   Report Status 10/08/2012 FINAL   Final     Studies: Mr Brain  Wo Contrast  10/07/2012   *RADIOLOGY REPORT*  Clinical Data:  79 year old male with altered mental status.  Left hand swelling.  Left wrist drop.  MRI HEAD WITHOUT CONTRAST MRI CERVICAL SPINE WITHOUT CONTRAST  Technique:  Multiplanar, multiecho pulse sequences of the brain and surrounding structures, and cervical spine, to include the craniocervical junction and cervicothoracic junction, were obtained without intravenous contrast.  Comparison:  [facility] brain MRI 09/27/2012 and earlier.  Head CT 10/06/2012.  MRI HEAD  Findings:  Stable cerebral volume. No restricted diffusion to suggest acute  infarction.  No midline shift, mass effect, evidence of mass lesion, ventriculomegaly, extra-axial collection or acute intracranial hemorrhage.  Cervicomedullary junction and pituitary are within normal limits.  Major intracranial vascular flow voids are stable.  Mild T2 heterogeneity in the deep gray matter nuclei, not significantly changed.  Favor small vessel ischemia related. Mild for age cerebral white matter T2 and FLAIR hyperintensity was better demonstrated on previous studies with left motion.  No acute signal abnormality identified in the brain.  Decreased bone marrow signal in the visualized cervical spine of unclear etiology and significance.  Grossly negative visualized cervical spinal cord.  Bone marrow heterogeneity also in the clivus which has increased from earlier this year.  No destructive osseous lesion identified.  Stable orbit soft tissues, paranasal sinuses, mastoids, and scalp soft tissues.  IMPRESSION: 1. No acute intracranial abnormality.  Stable noncontrast MRI appearance of the brain. 2.  Loss of normal bone marrow signal at the skull base and cervical spine, appears new or progressed since earlier this year. Query anemia or other benign etiology.  If the patient has a known malignancy metastatic disease to bone is not excluded. 3.  Additional cervical spine findings below.  MRI CERVICAL SPINE  Findings: Study is intermittently degraded by motion artifact despite repeated imaging attempts.  Mild reversal of cervical lordosis. No marrow edema or evidence of acute osseous abnormality.  There is mild increased signal in the C4-C5 disc space, but also some evidence of interbody ankylosis at this level.  Favor this signal changes degenerative.  There is fluid in both sternoclavicular joints (series 14 image 12).  No definite associated bone marrow edema in the adjacent manubrium or medial clavicles.  Visualized paraspinal soft tissues are within normal limits.  C2-C3:  Moderate facet  hypertrophy.  Mild disc bulge.  Ligament flavum hypertrophy.  Borderline spinal stenosis without spinal cord mass effect.  Moderate to severe bilateral C3 foraminal stenosis.  C3-C4:  Severe facet hypertrophy on the left.  Moderate facet hypertrophy on the right.  Negative disc and no spinal stenosis. Severe left greater than right C4 foraminal stenosis.  C4-C5:  Severe facet hypertrophy on the right.  No spinal stenosis. Moderate right C5 foraminal stenosis.  C5-C6:  Bulky mostly anterior eccentric circumferential disc osteophyte complex.  Bulky uncovertebral hypertrophy.  Moderate facet hypertrophy.  No significant spinal stenosis.  Severe right and moderate to severe left C6 foraminal stenosis.  C6-C7:  Bulky mostly right and anterior eccentric circumferential disc osteophyte complex.  No spinal stenosis.  Mild left C7 foraminal stenosis.  C7-T1:  Moderate facet hypertrophy.  Mild uncovertebral hypertrophy greater on the left.  Moderate left C8 foraminal stenosis.  No upper thoracic spinal stenosis identified.  IMPRESSION: 1.  No acute osseous abnormality identified in the cervical spine. There is bilateral sternoclavicular joint fluid, favor degenerative without strong evidence of sternoclavicular osteomyelitis at this time. 2.  Advanced cervical spine degenerative changes, especially facet arthropathy.  There is borderline to mild cervical spinal stenosis at C2-C3, otherwise moderate to severe foraminal stenosis is present (severe at most cervical neural foramen). 3.  Bulky lower cervical endplate osteophytosis, but primarily anteriorly directed, without associated cervical spinal stenosis.   Original Report Authenticated By: Erskine Speed, M.D.   Mr Cervical Spine Wo Contrast  10/07/2012   *RADIOLOGY REPORT*  Clinical Data:  79 year old male with altered mental status.  Left hand swelling.  Left wrist drop.  MRI HEAD WITHOUT CONTRAST MRI CERVICAL SPINE WITHOUT CONTRAST  Technique:  Multiplanar, multiecho pulse  sequences of the brain and surrounding structures, and cervical spine, to include the craniocervical junction and cervicothoracic junction, were obtained without intravenous contrast.  Comparison:  [facility] brain MRI 09/27/2012 and earlier.  Head CT 10/06/2012.  MRI HEAD  Findings:  Stable cerebral volume. No restricted diffusion to suggest acute infarction.  No midline shift, mass effect, evidence of mass lesion, ventriculomegaly, extra-axial collection or acute intracranial hemorrhage.  Cervicomedullary junction and pituitary are within normal limits.  Major intracranial vascular flow voids are stable.  Mild T2 heterogeneity in the deep gray matter nuclei, not significantly changed.  Favor small vessel ischemia related. Mild for age cerebral white matter T2 and FLAIR hyperintensity was better demonstrated on previous studies with left motion.  No acute signal abnormality identified in the brain.  Decreased bone marrow signal in the visualized cervical spine of unclear etiology and significance.  Grossly negative visualized cervical spinal cord.  Bone marrow heterogeneity also in the clivus which has increased from earlier this year.  No destructive osseous lesion identified.  Stable orbit soft tissues, paranasal sinuses, mastoids, and scalp soft tissues.  IMPRESSION: 1. No acute intracranial abnormality.  Stable noncontrast MRI appearance of the brain. 2.  Loss of normal bone marrow signal at the skull base and cervical spine, appears new or progressed since earlier this year. Query anemia or other benign etiology.  If the patient has a known malignancy metastatic disease to bone is not excluded. 3.  Additional cervical spine findings below.  MRI CERVICAL SPINE  Findings: Study is intermittently degraded by motion artifact despite repeated imaging attempts.  Mild reversal of cervical lordosis. No marrow edema or evidence of acute osseous abnormality.  There is mild increased signal in the C4-C5  disc space, but also some evidence of interbody ankylosis at this level.  Favor this signal changes degenerative.  There is fluid in both sternoclavicular joints (series 14 image 12).  No definite associated bone marrow edema in the adjacent manubrium or medial clavicles.  Visualized paraspinal soft tissues are within normal limits.  C2-C3:  Moderate facet hypertrophy.  Mild disc bulge.  Ligament flavum hypertrophy.  Borderline spinal stenosis without spinal cord mass effect.  Moderate to severe bilateral C3 foraminal stenosis.  C3-C4:  Severe facet hypertrophy on the left.  Moderate facet hypertrophy on the right.  Negative disc and no spinal stenosis. Severe left greater than right C4 foraminal stenosis.  C4-C5:  Severe facet hypertrophy on the right.  No spinal stenosis. Moderate right C5 foraminal stenosis.  C5-C6:  Bulky mostly anterior eccentric circumferential disc osteophyte complex.  Bulky uncovertebral hypertrophy.  Moderate facet hypertrophy.  No significant spinal stenosis.  Severe right and moderate to severe left C6 foraminal stenosis.  C6-C7:  Bulky mostly right and anterior eccentric circumferential disc osteophyte complex.  No spinal stenosis.  Mild left C7 foraminal stenosis.  C7-T1:  Moderate facet hypertrophy.  Mild uncovertebral hypertrophy greater on the  left.  Moderate left C8 foraminal stenosis.  No upper thoracic spinal stenosis identified.  IMPRESSION: 1.  No acute osseous abnormality identified in the cervical spine. There is bilateral sternoclavicular joint fluid, favor degenerative without strong evidence of sternoclavicular osteomyelitis at this time. 2.  Advanced cervical spine degenerative changes, especially facet arthropathy.  There is borderline to mild cervical spinal stenosis at C2-C3, otherwise moderate to severe foraminal stenosis is present (severe at most cervical neural foramen). 3.  Bulky lower cervical endplate osteophytosis, but primarily anteriorly directed, without  associated cervical spinal stenosis.   Original Report Authenticated By: Erskine Speed, M.D.   Dg Fluoro Guide Lumbar Puncture  10/08/2012   *RADIOLOGY REPORT*  Clinical Data:Altered mental status.  LUMBAR PUNCTURE FLUORO GUIDE  Fluoroscopy Time: 0 minutes 19 seconds  Comparison: Lumbar radiographs dated 09/09/2012  Findings: After obtaining telephone permission from the patient's son, after explaining to him the purpose, procedure, and risks including bleeding, infection, headache, and medication reactions, using sterile technique and local anesthesia and fluoroscopic guidance, I performed a lumbar puncture at the L3-4 level.  21 ml of clear colorless spinal fluid was obtained for laboratory examination.  The patient tolerated the procedure well.  There were no immediate complications.  IMPRESSION: Lumbar puncture performed with no complications.   Original Report Authenticated By: Francene Boyers, M.D.    Scheduled Meds: . carvedilol  12.5 mg Oral BID  . enoxaparin (LOVENOX) injection  80 mg Subcutaneous Q12H  . feeding supplement  1 Container Oral TID BM  . insulin aspart  0-15 Units Subcutaneous TID WC  . insulin aspart  0-5 Units Subcutaneous QHS  . insulin detemir  12 Units Subcutaneous QHS  . sodium chloride  3 mL Intravenous Q12H   Continuous Infusions: . 0.9 % NaCl with KCl 20 mEq / L 75 mL/hr at 10/08/12 1036      Time spent:25 minutes    [facility]  [facility] Pager [phone] If 7PM-7AM, please contact night-coverage at [email or web address], password [facility] 10/09/2012, 12:02 PM  LOS: 11 days

## 2012-10-09 NOTE — Progress Notes (Signed)
NEURO HOSPITALIST PROGRESS NOTE   SUBJECTIVE:                                                                                                                        Chart reviewed. He open his eyes but doesn't follows commands. Otherwise, no new neurological developments noted. EEG showed findings consistent with a non specific encephalopathy but remaining neurological work up including CSF unrevealing so far.    OBJECTIVE:                                                                                                                           Vital signs in last 24 hours: Temp:  [97.7 F (36.5 C)-99 F (37.2 C)] 98.7 F (37.1 C) (08/09 1352) Pulse Rate:  [62-97] 62 (08/09 1352) Resp:  [17-18] 17 (08/09 1352) BP: (125-168)/(60-95) 125/62 mmHg (08/09 1352) SpO2:  [98 %-100 %] 100 % (08/09 1352) Weight:  [83.054 kg (183 lb 1.6 oz)] 83.054 kg (183 lb 1.6 oz) (08/08 2137)  Intake/Output from previous day: 08/08 0701 - 08/09 0700 In: 20 [P.O.:20] Out: 1750 [Urine:1750] Intake/Output this shift: Total I/O In: 80 [P.O.:80] Out: 850 [Urine:850] Nutritional status: Dysphagia  Past Medical History  Diagnosis Date  . Aortic stenosis   . Diabetes mellitus type II   . Hypertension   . Gout   . GERD (gastroesophageal reflux disease)     Neurologic Exam:  Mental status: open eyes but is not able to follow commands. Knows his name but can not engage in conversation. CN 2-12: pupils 3-4 mm bilaterally reactive to light. No gaze preference. EOM full without nystagmus. Face seems to be symmetric. Tongue midline. Motor: seems to move the right side better than the left but seems to have pain in the left arm-leg. Sensory: reacts to pain. DTR's: 1 all over. Plantars: downgoing bilaterally. Coordination and gait: no tested. No meningeal signs.   Lab Results: No results found for this basename: cbc, bmp, coags, chol, tri, ldl, hga1c   Lipid  Panel No results found for this basename: CHOL, TRIG, HDL, CHOLHDL, VLDL, LDLCALC,  in the last 72 hours  Studies/Results: Mr Brain Wo Contrast  10/07/2012   *RADIOLOGY REPORT*  Clinical Data:  79 year old male with  altered mental status.  Left hand swelling.  Left wrist drop.  MRI HEAD WITHOUT CONTRAST MRI CERVICAL SPINE WITHOUT CONTRAST  Technique:  Multiplanar, multiecho pulse sequences of the brain and surrounding structures, and cervical spine, to include the craniocervical junction and cervicothoracic junction, were obtained without intravenous contrast.  Comparison:  [facility] brain MRI 09/27/2012 and earlier.  Head CT 10/06/2012.  MRI HEAD  Findings:  Stable cerebral volume. No restricted diffusion to suggest acute infarction.  No midline shift, mass effect, evidence of mass lesion, ventriculomegaly, extra-axial collection or acute intracranial hemorrhage.  Cervicomedullary junction and pituitary are within normal limits.  Major intracranial vascular flow voids are stable.  Mild T2 heterogeneity in the deep gray matter nuclei, not significantly changed.  Favor small vessel ischemia related. Mild for age cerebral white matter T2 and FLAIR hyperintensity was better demonstrated on previous studies with left motion.  No acute signal abnormality identified in the brain.  Decreased bone marrow signal in the visualized cervical spine of unclear etiology and significance.  Grossly negative visualized cervical spinal cord.  Bone marrow heterogeneity also in the clivus which has increased from earlier this year.  No destructive osseous lesion identified.  Stable orbit soft tissues, paranasal sinuses, mastoids, and scalp soft tissues.  IMPRESSION: 1. No acute intracranial abnormality.  Stable noncontrast MRI appearance of the brain. 2.  Loss of normal bone marrow signal at the skull base and cervical spine, appears new or progressed since earlier this year. Query anemia or other benign etiology.   If the patient has a known malignancy metastatic disease to bone is not excluded. 3.  Additional cervical spine findings below.  MRI CERVICAL SPINE  Findings: Study is intermittently degraded by motion artifact despite repeated imaging attempts.  Mild reversal of cervical lordosis. No marrow edema or evidence of acute osseous abnormality.  There is mild increased signal in the C4-C5 disc space, but also some evidence of interbody ankylosis at this level.  Favor this signal changes degenerative.  There is fluid in both sternoclavicular joints (series 14 image 12).  No definite associated bone marrow edema in the adjacent manubrium or medial clavicles.  Visualized paraspinal soft tissues are within normal limits.  C2-C3:  Moderate facet hypertrophy.  Mild disc bulge.  Ligament flavum hypertrophy.  Borderline spinal stenosis without spinal cord mass effect.  Moderate to severe bilateral C3 foraminal stenosis.  C3-C4:  Severe facet hypertrophy on the left.  Moderate facet hypertrophy on the right.  Negative disc and no spinal stenosis. Severe left greater than right C4 foraminal stenosis.  C4-C5:  Severe facet hypertrophy on the right.  No spinal stenosis. Moderate right C5 foraminal stenosis.  C5-C6:  Bulky mostly anterior eccentric circumferential disc osteophyte complex.  Bulky uncovertebral hypertrophy.  Moderate facet hypertrophy.  No significant spinal stenosis.  Severe right and moderate to severe left C6 foraminal stenosis.  C6-C7:  Bulky mostly right and anterior eccentric circumferential disc osteophyte complex.  No spinal stenosis.  Mild left C7 foraminal stenosis.  C7-T1:  Moderate facet hypertrophy.  Mild uncovertebral hypertrophy greater on the left.  Moderate left C8 foraminal stenosis.  No upper thoracic spinal stenosis identified.  IMPRESSION: 1.  No acute osseous abnormality identified in the cervical spine. There is bilateral sternoclavicular joint fluid, favor degenerative without strong evidence of  sternoclavicular osteomyelitis at this time. 2.  Advanced cervical spine degenerative changes, especially facet arthropathy.  There is borderline to mild cervical spinal stenosis at C2-C3, otherwise moderate to severe foraminal  stenosis is present (severe at most cervical neural foramen). 3.  Bulky lower cervical endplate osteophytosis, but primarily anteriorly directed, without associated cervical spinal stenosis.   Original Report Authenticated By: Erskine Speed, M.D.   Mr Cervical Spine Wo Contrast  10/07/2012   *RADIOLOGY REPORT*  Clinical Data:  79 year old male with altered mental status.  Left hand swelling.  Left wrist drop.  MRI HEAD WITHOUT CONTRAST MRI CERVICAL SPINE WITHOUT CONTRAST  Technique:  Multiplanar, multiecho pulse sequences of the brain and surrounding structures, and cervical spine, to include the craniocervical junction and cervicothoracic junction, were obtained without intravenous contrast.  Comparison:  [facility] brain MRI 09/27/2012 and earlier.  Head CT 10/06/2012.  MRI HEAD  Findings:  Stable cerebral volume. No restricted diffusion to suggest acute infarction.  No midline shift, mass effect, evidence of mass lesion, ventriculomegaly, extra-axial collection or acute intracranial hemorrhage.  Cervicomedullary junction and pituitary are within normal limits.  Major intracranial vascular flow voids are stable.  Mild T2 heterogeneity in the deep gray matter nuclei, not significantly changed.  Favor small vessel ischemia related. Mild for age cerebral white matter T2 and FLAIR hyperintensity was better demonstrated on previous studies with left motion.  No acute signal abnormality identified in the brain.  Decreased bone marrow signal in the visualized cervical spine of unclear etiology and significance.  Grossly negative visualized cervical spinal cord.  Bone marrow heterogeneity also in the clivus which has increased from earlier this year.  No destructive osseous lesion  identified.  Stable orbit soft tissues, paranasal sinuses, mastoids, and scalp soft tissues.  IMPRESSION: 1. No acute intracranial abnormality.  Stable noncontrast MRI appearance of the brain. 2.  Loss of normal bone marrow signal at the skull base and cervical spine, appears new or progressed since earlier this year. Query anemia or other benign etiology.  If the patient has a known malignancy metastatic disease to bone is not excluded. 3.  Additional cervical spine findings below.  MRI CERVICAL SPINE  Findings: Study is intermittently degraded by motion artifact despite repeated imaging attempts.  Mild reversal of cervical lordosis. No marrow edema or evidence of acute osseous abnormality.  There is mild increased signal in the C4-C5 disc space, but also some evidence of interbody ankylosis at this level.  Favor this signal changes degenerative.  There is fluid in both sternoclavicular joints (series 14 image 12).  No definite associated bone marrow edema in the adjacent manubrium or medial clavicles.  Visualized paraspinal soft tissues are within normal limits.  C2-C3:  Moderate facet hypertrophy.  Mild disc bulge.  Ligament flavum hypertrophy.  Borderline spinal stenosis without spinal cord mass effect.  Moderate to severe bilateral C3 foraminal stenosis.  C3-C4:  Severe facet hypertrophy on the left.  Moderate facet hypertrophy on the right.  Negative disc and no spinal stenosis. Severe left greater than right C4 foraminal stenosis.  C4-C5:  Severe facet hypertrophy on the right.  No spinal stenosis. Moderate right C5 foraminal stenosis.  C5-C6:  Bulky mostly anterior eccentric circumferential disc osteophyte complex.  Bulky uncovertebral hypertrophy.  Moderate facet hypertrophy.  No significant spinal stenosis.  Severe right and moderate to severe left C6 foraminal stenosis.  C6-C7:  Bulky mostly right and anterior eccentric circumferential disc osteophyte complex.  No spinal stenosis.  Mild left C7 foraminal  stenosis.  C7-T1:  Moderate facet hypertrophy.  Mild uncovertebral hypertrophy greater on the left.  Moderate left C8 foraminal stenosis.  No upper thoracic spinal stenosis identified.  IMPRESSION: 1.  No acute osseous abnormality identified in the cervical spine. There is bilateral sternoclavicular joint fluid, favor degenerative without strong evidence of sternoclavicular osteomyelitis at this time. 2.  Advanced cervical spine degenerative changes, especially facet arthropathy.  There is borderline to mild cervical spinal stenosis at C2-C3, otherwise moderate to severe foraminal stenosis is present (severe at most cervical neural foramen). 3.  Bulky lower cervical endplate osteophytosis, but primarily anteriorly directed, without associated cervical spinal stenosis.   Original Report Authenticated By: Erskine Speed, M.D.   Dg Fluoro Guide Lumbar Puncture  10/08/2012   *RADIOLOGY REPORT*  Clinical Data:Altered mental status.  LUMBAR PUNCTURE FLUORO GUIDE  Fluoroscopy Time: 0 minutes 19 seconds  Comparison: Lumbar radiographs dated 09/09/2012  Findings: After obtaining telephone permission from the patient's son, after explaining to him the purpose, procedure, and risks including bleeding, infection, headache, and medication reactions, using sterile technique and local anesthesia and fluoroscopic guidance, I performed a lumbar puncture at the L3-4 level.  21 ml of clear colorless spinal fluid was obtained for laboratory examination.  The patient tolerated the procedure well.  There were no immediate complications.  IMPRESSION: Lumbar puncture performed with no complications.   Original Report Authenticated By: Francene Boyers, M.D.    MEDICATIONS                                                                                                                       I have reviewed the patient's current medications.  ASSESSMENT/PLAN:                                                                                                             79 years old with progressive encephalopathy of unknown etiology. Some of CSF testing pending (I am not sure if 14-3-3 lab was sent). Serum and CSF paraneoplastic profile. Will follow up  Wyatt Portela, MD [facility] [phone]  10/09/2012, 2:13 PM

## 2012-10-09 NOTE — Progress Notes (Signed)
ANTICOAGULATION CONSULT NOTE - Follow Up Consult  Pharmacy Consult for Lovenox Indication: DVT  No Known Allergies  Patient Measurements: Height: 5\' 9"  (175.3 cm) Weight: 183 lb 1.6 oz (83.054 kg) IBW/kg (Calculated) : 70.7   Vital Signs: Temp: 98.7 F (37.1 C) (08/09 1352) Temp src: Oral (08/09 1352) BP: 125/62 mmHg (08/09 1352) Pulse Rate: 62 (08/09 1352)  Labs:  Recent Labs  10/07/12 0400 10/07/12 1456 10/08/12 0500  HGB 9.5*  --   --   HCT 27.7*  --   --   PLT 328  --   --   LABPROT  --  14.0  --   INR  --  1.10  --   CREATININE  --  1.21 1.18    Estimated Creatinine Clearance: 49.9 ml/min (by C-G formula based on Cr of 1.18).   Assessment: 82 YOM being treated for LLE DVT, encephalopathy, and AoCKD. His SCr continues to improve, CrCl ~ 50 ml/min.  CBC stable.  No bleeding noted.  Goal of Therapy:  Anti-Xa level 0.6-1.2 units/ml 4hrs after LMWH dose given Monitor platelets by anticoagulation protocol: Yes   Plan:  1. Cpntinue Lovenox  80mg  subq Q12hr. 2. CBC Q72hr 3. F/u renal function, s/s bleeding, clinical progression, transition to oral anticoagulants  [employer], Pharm.D., BCPS Clinical Pharmacist Pager [phone] 10/09/2012 2:27 PM

## 2012-10-09 NOTE — Progress Notes (Signed)
Patient not oriented x3, called son to discuss d/c plans. Son did not answer, CSW left message requesting for him to call CSW back.   Samuella Bruin, LCSWA [facility] Emergency Dept. [phone]

## 2012-10-10 ENCOUNTER — Inpatient Hospital Stay (HOSPITAL_COMMUNITY): Payer: Medicare Other

## 2012-10-10 DIAGNOSIS — M25532 Pain in left wrist: Secondary | ICD-10-CM | POA: Diagnosis present

## 2012-10-10 DIAGNOSIS — M25539 Pain in unspecified wrist: Secondary | ICD-10-CM

## 2012-10-10 LAB — CBC
HCT: 27.4 % — ABNORMAL LOW (ref 39.0–52.0)
Hemoglobin: 9.4 g/dL — ABNORMAL LOW (ref 13.0–17.0)
MCH: 33.3 pg (ref 26.0–34.0)
MCHC: 34.3 g/dL (ref 30.0–36.0)
MCV: 97.2 fL (ref 78.0–100.0)
RDW: 13.7 % (ref 11.5–15.5)

## 2012-10-10 LAB — CULTURE, BLOOD (ROUTINE X 2): Culture: NO GROWTH

## 2012-10-10 LAB — GLUCOSE, CAPILLARY
Glucose-Capillary: 128 mg/dL — ABNORMAL HIGH (ref 70–99)
Glucose-Capillary: 219 mg/dL — ABNORMAL HIGH (ref 70–99)
Glucose-Capillary: 144 mg/dL — ABNORMAL HIGH (ref 70–99)
Glucose-Capillary: 216 mg/dL — ABNORMAL HIGH (ref 70–99)

## 2012-10-10 MED ORDER — PREDNISONE 20 MG PO TABS
30.0000 mg | ORAL_TABLET | Freq: Once | ORAL | Status: AC
  Administered 2012-10-10: 30 mg via ORAL
  Filled 2012-10-10: qty 1

## 2012-10-10 NOTE — Progress Notes (Signed)
[facility] PROGRESS NOTE  NGOC DAUGHTRIDGE AVW:[id number] DOB: [birth year] DOA: 09/28/2012 PCP: Kirstie Peri, MD  Brief narrative  79 year old male who was recently hospitalized at [facility] on 09/09/2012 for confusion and generalized weakness.During that admission, it was felt the patient may have been having focal seizures and he was started on Tegretol . He was discharged to a SNF. Two weeks later patient developed increasing lethargy and poor oral intake. He was readmitted to [facility] on 09/26/2012. 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 He was found to be in acute renal failure with hypernatremia and was hydrated. He remained confused and encephalopathic. Family spoke with the patient's primary care physician and requested the [facility].   Assessment/Plan:   Encephalopathy ? Chronic- unclear etiology - Initially thought due to hypernatremia and fever with UTI  - prior MRI reported as negative  - EEG suggests encephalopathy, no evidence of seizure activity or foci  - Patient continues to be encephalopathic. Also had a fever on 8/4. Fever workup so far negative. He does seem to have some left-sided weakness on exam however difficult to assess given his encephalopathy. Repeat head CT which is unremarkable except for chronic small vessel disease. TSH, RPR, B12 wnl - Repeat MRI brain and C-spine without acute findings.  - Repeat EEG does not show seizure like activity. Gen. background slowing. - Had LP 8/8:1 WBC, normal protein and cryptococcal antigen negative. CSF culture, fungal culture, VDRL, B. Burgdorfi antibodies, HSV, IgG index, oligoclonal bands, beta isoform are in process. - Neurology follow up 8/10 appreciated.  Left wrist drop - Noted on exam 8/7.  Unclear of duration of this symptoms. Has tenderness to palpation of left wrist-improved, but still mildly tender and warm.   - Uric acid: 2.9. - MRI C-spine shows advanced  cervical spine degenerative changes, mild cervical spinal stenosis at 2-3, otherwise moderate to severe foramina stenosis. Not sure if these are contributing to the wrist drop- discussed with Neurology and will see today. - ? Gout - trial of Prednisone. No NSAID's due to anticoagulation or colchicine due to recent AKI  - Will check an X Ray of wrist  Fever/leukocytosis  -UA positive but urine culture negative  - abx discontinued ( zosyn) but was resumed on 8/4 along with vanco as patient became febrile again to 102.70F. Blood cx negative. CXR negative for infiltrate  - No clear source of infection. Antibiotics discontinued. - ? Fever secondary to DVT. Resolved for days.  Dehydration with hypernatremia  -resolved with IV fluids  -was on Lasix prior to admission   LLE edema/acute DVT  - venous duplex shows extensive DVT LLE  - on full dose Lovenox since 7/31 .  -switch to Xarelto when taking po well .   Acute renal failure on CKD (chronic kidney disease) stage 3, GFR 30-59 ml/min  -baseline Scr 1.18 with GFR around 60  -likely due to dehydration - granular cast in UA suggest ATN as well  -[place] normal  - Resolved.  Chronic diastolic congestive heart failure  -Follows with [facility]  -euvolemic at present  - Clinically compensated.  Hypokalemia  -relpenished   Stage 2 sacral decubitus  -appreciate Wound RN eval  -  Mild Dysphagia  -SLP eval rec D1 diet w/ nectar liquids  - [employer] food. - ate 75% breakfast on 8/10  DM -Last A1c 6.5  -fsg stable   continue levemir and SSI - Reasonable inpatient control.  HTN  -  improved  -continue to hold ACE inhibitor and Lasix for now   CAD (coronary artery disease)  -Stable   Thrombocytopenia, unspecified  resolved   Anemia  -hgb stable   ?Seizure disorder  -Recently started on Tegretol for suspected focal seizures - has been on hold due to lethargy  -EEG without evidence of seizure . Repeat EEG without seizure like  activity.    DVT prophylaxis: full dose Lovenox  Code Status: Full  Family Communication: D/W son Mr. Lavar Rosenzweig on 8/9. Disposition Plan: SNF once improved- ? In next 1-2 days if no further inpt Neuro w/u.  Consultants:  Neurology  Interventional radiology.  Procedures:  EEG  LLE doppler LP 8/8  Antibiotics:  Zosyn 7/29 >> 8/1  8/4 >> 8/7 Vancomycin 8/4>> 8/7  HPI/Subjective:  Doesn't say much. No acute events per nursing.  Objective: Filed Vitals:   10/10/12 1000  BP: 162/82  Pulse: 62  Temp: 98.4 F (36.9 C)  Resp: 18    Intake/Output Summary (Last 24 hours) at 10/10/12 1111 Last data filed at 10/10/12 0834  Gross per 24 hour  Intake     70 ml  Output   1800 ml  Net  -1730 ml   Filed Weights   10/07/12 2308 10/08/12 2137 10/09/12 2037  Weight: 87.5 kg (192 lb 14.4 oz) 83.054 kg (183 lb 1.6 oz) 82.555 kg (182 lb)    Exam:   General: elderly frail male in NAD  HEENT: no pallor, moist mucosa  CHEST: Clear to auscultation bilaterally. No increased work of breathing.  Cardiovascular: Regular rate and rhythm No murmur gallop or rub normal. Telemetry: Sinus rhythm- (will DC tele 8/10).  Abdomen: soft, Nontender, nondistended, soft, bowel sounds positive,  Musculoskeletal: warm, swelling over left leg with 2+ pitting edema  Neurological: Awake and occasionally follows few simple commands,Oriented only to self-will occasionally answer to questions in one-word . Left wrist drop. Mild increased warmth and tenderness of left wrist.   Data Reviewed: Basic Metabolic Panel:  Recent Labs Lab 10/05/12 0500 10/06/12 0500 10/07/12 1456 10/08/12 0500  NA 144 145 142 142  K 3.9 4.3 3.9 3.9  CL 110 113* 107 106  CO2 24 23 22 24   GLUCOSE 229* 187* 147* 153*  BUN 20 24* 19 20  CREATININE 1.42* 1.46* 1.21 1.18  CALCIUM 8.5 8.7 8.8 8.9   Liver Function Tests: No results found for this basename: AST, ALT, ALKPHOS, BILITOT, PROT, ALBUMIN,  in the last 168  hours No results found for this basename: LIPASE, AMYLASE,  in the last 168 hours No results found for this basename: AMMONIA,  in the last 168 hours CBC:  Recent Labs Lab 10/04/12 0510 10/05/12 0500 10/07/12 0400 10/10/12 0500  WBC 10.4 10.5 9.3 9.7  HGB 10.7* 9.4* 9.5* 9.4*  HCT 30.9* 27.4* 27.7* 27.4*  MCV 97.2 98.6 98.6 97.2  PLT 258 261 328 427*   Cardiac Enzymes: No results found for this basename: CKTOTAL, CKMB, CKMBINDEX, TROPONINI,  in the last 168 hours BNP (last 3 results) No results found for this basename: PROBNP,  in the last 8760 hours CBG:  Recent Labs Lab 10/09/12 0741 10/09/12 1217 10/09/12 1721 10/09/12 2112 10/10/12 0729  GLUCAP 101* 155* 146* 196* 128*    Recent Results (from the past 240 hour(s))  CULTURE, BLOOD (ROUTINE X 2)     Status: None   Collection Time    10/04/12  5:50 PM      Result Value Range Status  Specimen Description BLOOD RIGHT ARM   Final   Special Requests BOTTLES DRAWN AEROBIC AND ANAEROBIC 10CC   Final   Culture  Setup Time     Final   Value: 10/04/2012 22:52     Performed at [facility]   Culture     Final   Value:        BLOOD CULTURE RECEIVED NO GROWTH TO DATE CULTURE WILL BE HELD FOR 5 DAYS BEFORE ISSUING A FINAL NEGATIVE REPORT     Performed at [facility]   Report Status PENDING   Incomplete  CULTURE, BLOOD (ROUTINE X 2)     Status: None   Collection Time    10/04/12  5:57 PM      Result Value Range Status   Specimen Description BLOOD RIGHT ARM   Final   Special Requests BOTTLES DRAWN AEROBIC AND ANAEROBIC 10CC   Final   Culture  Setup Time     Final   Value: 10/04/2012 22:53     Performed at [facility]   Culture     Final   Value:        BLOOD CULTURE RECEIVED NO GROWTH TO DATE CULTURE WILL BE HELD FOR 5 DAYS BEFORE ISSUING A FINAL NEGATIVE REPORT     Performed at [facility]   Report Status PENDING   Incomplete  URINE CULTURE     Status: None   Collection Time     10/04/12  6:35 PM      Result Value Range Status   Specimen Description URINE, CLEAN CATCH   Final   Special Requests NONE   Final   Culture  Setup Time     Final   Value: 10/04/2012 20:43     Performed at [facility] Count     Final   Value: NO GROWTH     Performed at [facility]   Culture     Final   Value: NO GROWTH     Performed at [facility]   Report Status 10/06/2012 FINAL   Final  CSF CULTURE     Status: None   Collection Time    10/08/12  2:36 PM      Result Value Range Status   Specimen Description CSF   Final   Special Requests 5.5ML CSF FLUID   Final   Gram Stain     Final   Value: RARE WBC PRESENT, PREDOMINANTLY MONONUCLEAR     NO ORGANISMS SEEN     Performed at [facility]     Performed at [facility]   Culture     Final   Value: NO GROWTH     Performed at [facility]   Report Status PENDING   Incomplete  GRAM STAIN     Status: None   Collection Time    10/08/12  2:36 PM      Result Value Range Status   Specimen Description CSF   Final   Special Requests 5.5ML CSF FLUID   Final   Gram Stain     Final   Value: CYTOSPIN PREP     WBC PRESENT, PREDOMINANTLY MONONUCLEAR     NO ORGANISMS SEEN   Report Status 10/08/2012 FINAL   Final  FUNGUS CULTURE W SMEAR     Status: None   Collection Time    10/08/12  2:36 PM      Result Value Range Status  Specimen Description CSF   Final   Special Requests 5.5ML FLUID   Final   Fungal Smear     Final   Value: NO YEAST OR FUNGAL ELEMENTS SEEN     Performed at [facility]   Culture     Final   Value: CULTURE IN PROGRESS FOR FOUR WEEKS     Performed at [facility]   Report Status PENDING   Incomplete     Studies: Dg Fluoro Guide Lumbar Puncture  10/08/2012   *RADIOLOGY REPORT*  Clinical Data:Altered mental status.  LUMBAR PUNCTURE FLUORO GUIDE  Fluoroscopy Time: 0 minutes 19 seconds  Comparison: Lumbar radiographs dated 09/09/2012   Findings: After obtaining telephone permission from the patient's son, after explaining to him the purpose, procedure, and risks including bleeding, infection, headache, and medication reactions, using sterile technique and local anesthesia and fluoroscopic guidance, I performed a lumbar puncture at the L3-4 level.  21 ml of clear colorless spinal fluid was obtained for laboratory examination.  The patient tolerated the procedure well.  There were no immediate complications.  IMPRESSION: Lumbar puncture performed with no complications.   Original Report Authenticated By: Francene Boyers, M.D.    Scheduled Meds: . carvedilol  12.5 mg Oral BID  . enoxaparin (LOVENOX) injection  80 mg Subcutaneous Q12H  . feeding supplement  1 Container Oral TID BM  . insulin aspart  0-15 Units Subcutaneous TID WC  . insulin aspart  0-5 Units Subcutaneous QHS  . insulin detemir  12 Units Subcutaneous QHS  . senna  2 tablet Oral Daily  . sodium chloride  3 mL Intravenous Q12H   Continuous Infusions: . 0.9 % NaCl with KCl 20 mEq / L 75 mL/hr at 10/09/12 1701      Time spent:15 minutes    [facility]  [facility] Pager [phone] If 7PM-7AM, please contact night-coverage at [email or web address], password [facility] 10/10/2012, 11:11 AM  LOS: 12 days

## 2012-10-10 NOTE — Progress Notes (Signed)
NEURO HOSPITALIST PROGRESS NOTE   SUBJECTIVE:                                                                                                                        He is definitively more alert today and is able to say his name and age. Left wrist drop with associated pain on palpation. CSF 14-3-3 results pending.  OBJECTIVE:                                                                                                                           Vital signs in last 24 hours: Temp:  [98 F (36.7 C)-98.7 F (37.1 C)] 98.4 F (36.9 C) (08/10 1000) Pulse Rate:  [62-73] 62 (08/10 1000) Resp:  [17-18] 18 (08/10 1000) BP: (119-170)/(62-82) 162/82 mmHg (08/10 1000) SpO2:  [94 %-100 %] 98 % (08/10 1000) Weight:  [82.555 kg (182 lb)] 82.555 kg (182 lb) (08/09 2037)  Intake/Output from previous day: 08/09 0701 - 08/10 0700 In: 80 [P.O.:80] Out: 1800 [Urine:1800] Intake/Output this shift: Total I/O In: 50 [P.O.:50] Out: -  Nutritional status: Dysphagia  Past Medical History  Diagnosis Date  . Aortic stenosis   . Diabetes mellitus type II   . Hypertension   . Gout   . GERD (gastroesophageal reflux disease)     Neurologic Exam:  Mental status: alert and awake. Knows his name and age but doesn't follow commands.  CN 2-12: pupils 3-4 mm bilaterally reactive to light. No gaze preference. EOM full without nystagmus. Face seems to be symmetric. Tongue midline.  Motor: seems to move the right side better than the left but seems to have pain in the left arm-leg, mainly localized to the left wrist which limits reliable evaluation left hand.  Sensory: no tested.  DTR's: 1 all over.  Plantars: downgoing bilaterally.  Coordination and gait: no tested.  No meningeal signs.   Lab Results: No results found for this basename: cbc, bmp, coags, chol, tri, ldl, hga1c   Lipid Panel No results found for this basename: CHOL, TRIG, HDL, CHOLHDL, VLDL,  LDLCALC,  in the last 72 hours  Studies/Results: Dg Fluoro Guide Lumbar Puncture  10/08/2012   *RADIOLOGY REPORT*  Clinical Data:Altered mental status.  LUMBAR PUNCTURE  FLUORO GUIDE  Fluoroscopy Time: 0 minutes 19 seconds  Comparison: Lumbar radiographs dated 09/09/2012  Findings: After obtaining telephone permission from the patient's son, after explaining to him the purpose, procedure, and risks including bleeding, infection, headache, and medication reactions, using sterile technique and local anesthesia and fluoroscopic guidance, I performed a lumbar puncture at the L3-4 level.  21 ml of clear colorless spinal fluid was obtained for laboratory examination.  The patient tolerated the procedure well.  There were no immediate complications.  IMPRESSION: Lumbar puncture performed with no complications.   Original Report Authenticated By: Francene Boyers, M.D.    MEDICATIONS                                                                                                                       I have reviewed the patient's current medications.  ASSESSMENT/PLAN:                                                                                                           Encephalopathy of unclear etiology. Awaiting results CSF 14-3-3 Some improvement in mental status today. Left wrist drop: difficult to assess due to associated pain on palpation and patient unable to follow commands for muscle strength testing. Differential includes left radial palsy versus fracture. Await for x ray and then can try to perform electrophysiological testing when pain under better control. Will continue to follow.    Wyatt Portela, MD [facility] [phone]  10/10/2012, 11:39 AM

## 2012-10-11 LAB — HERPES SIMPLEX VIRUS(HSV) DNA BY PCR: HSV 1 DNA: NOT DETECTED

## 2012-10-11 LAB — GLUCOSE, CAPILLARY
Glucose-Capillary: 215 mg/dL — ABNORMAL HIGH (ref 70–99)
Glucose-Capillary: 169 mg/dL — ABNORMAL HIGH (ref 70–99)

## 2012-10-11 LAB — VDRL, CSF: VDRL Quant, CSF: NONREACTIVE

## 2012-10-11 MED ORDER — PREDNISONE 10 MG PO TABS
30.0000 mg | ORAL_TABLET | Freq: Every day | ORAL | Status: DC
  Filled 2012-10-11 (×2): qty 1

## 2012-10-11 MED ORDER — RIVAROXABAN 15 MG PO TABS
15.0000 mg | ORAL_TABLET | Freq: Two times a day (BID) | ORAL | Status: DC
  Administered 2012-10-11 – 2012-10-15 (×8): 15 mg via ORAL
  Filled 2012-10-11 (×12): qty 1

## 2012-10-11 MED ORDER — INSULIN DETEMIR 100 UNIT/ML ~~LOC~~ SOLN
15.0000 [IU] | Freq: Every day | SUBCUTANEOUS | Status: DC
  Administered 2012-10-11: 15 [IU] via SUBCUTANEOUS
  Filled 2012-10-11 (×2): qty 0.15

## 2012-10-11 MED ORDER — SODIUM CHLORIDE 0.9 % IV SOLN
1000.0000 mg | Freq: Every day | INTRAVENOUS | Status: AC
  Administered 2012-10-11 – 2012-10-15 (×5): 1000 mg via INTRAVENOUS
  Filled 2012-10-11 (×5): qty 8

## 2012-10-11 MED ORDER — RIVAROXABAN 20 MG PO TABS: 20.0000 mg | ORAL_TABLET | Freq: Every day | ORAL | Status: DC

## 2012-10-11 NOTE — Progress Notes (Signed)
Subjective: no changes   Exam: Filed Vitals:   10/11/12 1800  BP: 154/58  Pulse: 58  Temp: 98.8 F (37.1 C)  Resp: 18   Gen: In bed, NAD MS: In bed, NAD. Follows some commands today, says "pretty good" when asked how he is doing, otherwise no verbal output. CN: Pupils equal round and reactive to light, blinks to threat bilaterally Motor: Significant left-sided weakness which is distal more than proximal, including a significant left wrist weakness. It is unclear to me if this represents sequelae from his stroke versus a peripheral problem. Either way, OT may be beneficial for splinting. Sensory: Intact to light touch  Impression: 79 year old male with progressive encephalopathy over the past couple of months. Thyroid antibodies are negative. This time his workup was done predominantly negative, without explanation for his progressive encephalopathy. Given the density of his encephalopathy, I am concerned for an autoimmune cause.  A pulsed dose of steroids may be worthwhile as empiric treatment, though I would not pursue further treatment unless he were to have a clear diagnosis.  Recommendations: 1) autoimmune encephalitis panel, including voltage-gated potassium channel 2) star steroids for possible autoimmune encephalopathy slight Medrol, 1 g daily for 3-5 days 3) will await autoimmune panel as well as protein 14 33 from CSF  Ritta Slot, MD [facility] [phone]  If 7pm- 7am, please page neurology on call at [phone].

## 2012-10-11 NOTE — Progress Notes (Signed)
ANTICOAGULATION CONSULT NOTE - Follow Up Consult  Pharmacy Consult for Lovenox >> Xarelto Indication: LLE DVT  No Known Allergies  Patient Measurements: Height: 5\' 9"  (175.3 cm) Weight: 184 lb 14.4 oz (83.87 kg) IBW/kg (Calculated) : 70.7  Vital Signs: Temp: 98.6 F (37 C) (08/11 0921) Temp src: Oral (08/11 0921) BP: 165/79 mmHg (08/11 0921) Pulse Rate: 76 (08/11 0921)  Labs:  Recent Labs  10/10/12 0500  HGB 9.4*  HCT 27.4*  PLT 427*    Estimated Creatinine Clearance: 49.9 ml/min (by C-G formula based on Cr of 1.18).   Assessment: 79 y.o. M on full-dose enoxaparin since 7/31 for treatment of new acute LLE DVT. Per discussion with Dr. Waymon Amato, the patient is to transition to Xarelto to complete treatment for DVT.  The patient's days on lovenox will count towards the bid dosing of Xarelto. Will plan to start 15 mg bid this evening with a stop date of 8/21 -- and transition to 20 mg once daily with supper on 8/22.  Will plan to education prior to discharge -- will attempt to wait for family to be present given the patient's current AMS.  Goal of Therapy:  Appropriate anticoagulation   Plan:  1. D/c Lovenox 2. Xarelto 15 mg bid with meals starting this evening at 1700 and continuing thru 8/21 3. Start Xarelto 20 mg once daily with supper on 8/21 4. Will monitor for any s/sx of bleeding  Georgina Pillion, PharmD, BCPS Clinical Pharmacist Pager: [phone] 10/11/2012 11:20 AM

## 2012-10-11 NOTE — Progress Notes (Signed)
Patient for SNF at d/c- was at [facility] but family prefers [facility] at d/c- I am working with Maryruth Bun for hopeful SNF bed offer once stable for d/c - will update tomorrow   Reece Levy, MSW [phone]

## 2012-10-11 NOTE — Progress Notes (Signed)
[facility] PROGRESS NOTE  Shaun Whitney WUJ:[id number] DOB: 03-Jan-1931 DOA: 09/28/2012 PCP: Kirstie Peri, MD  Brief narrative  79 year old male who was recently hospitalized at [facility] on 09/09/2012 for confusion and generalized weakness.During that admission, it was felt the patient may have been having focal seizures and he was started on Tegretol . He was discharged to a SNF. Two weeks later patient developed increasing lethargy and poor oral intake. He was readmitted to [facility] on 09/26/2012. 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 He was found to be in acute renal failure with hypernatremia and was hydrated. He remained confused and encephalopathic. Family spoke with the patient's primary care physician and requested the patient be transferred to [facility].   Assessment/Plan:   Encephalopathy ? Chronic- unclear etiology - Initially thought due to hypernatremia and fever with UTI  - prior MRI reported as negative  - EEG suggests encephalopathy, no evidence of seizure activity or foci  - Patient continues to be encephalopathic. Also had a fever on 8/4. Fever workup so far negative. He does seem to have some left-sided weakness on exam however difficult to assess given his encephalopathy. Repeat head CT which is unremarkable except for chronic small vessel disease. TSH, RPR, B12 wnl - Repeat MRI brain and C-spine without acute findings.  - Repeat EEG does not show seizure like activity. Gen. background slowing. - Had LP 8/8:1 WBC, normal protein and cryptococcal antigen negative. CSF culture, fungal culture, VDRL, B. Burgdorfi antibodies, HSV, IgG index, oligoclonal bands, beta isoform are in process. - Discussed with neurology Dr. Amada Jupiter on 8/11- recommended starting IV Solumedrol 1g daily for 3-5 days.  Left wrist drop - Noted on exam 8/7.  Unclear of duration of this symptoms. Has tenderness to palpation of left wrist-improved, but still  mildly tender and warm.   - Uric acid: 2.9. - MRI C-spine shows advanced cervical spine degenerative changes, mild cervical spinal stenosis at 2-3, otherwise moderate to severe foramina stenosis. Not sure if these are contributing to the wrist drop- discussed with Neurology and will see today. - ? Gout - trial of Prednisone. No NSAID's due to anticoagulation or colchicine due to recent AKI  - Will check an X Ray of wrist- negative - Solumedrol should help.  Fever/leukocytosis  -UA positive but urine culture negative  - abx discontinued ( zosyn) but was resumed on 8/4 along with vanco as patient became febrile again to 102.42F. Blood cx negative. CXR negative for infiltrate  - No clear source of infection. Antibiotics discontinued. - ? Fever secondary to DVT. Resolved for days.  Dehydration with hypernatremia  -resolved with IV fluids  -was on Lasix prior to admission   LLE edema/acute DVT  - venous duplex shows extensive DVT LLE  - on full dose Lovenox since 7/31 .  -switched to Xarelto- monitor if taking  Acute renal failure on CKD (chronic kidney disease) stage 3, GFR 30-59 ml/min  -baseline Scr 1.18 with GFR around 60  -likely due to dehydration - granular cast in UA suggest ATN as well  -[place] normal  - Resolved.  Chronic diastolic congestive heart failure  -Follows with [place] cardiology  -euvolemic at present  - Clinically compensated.  Hypokalemia  -relpenished   Stage 2 sacral decubitus  -appreciate Wound RN eval & FU. -  Mild Dysphagia  -SLP eval rec D1 diet w/ nectar liquids  - [employer] food. - ate 75% breakfast on 8/10  DM -Last A1c 6.5  -  fsg stable   continue levemir - increased dose slightly due to solumedrol and SSI - Reasonable inpatient control.  HTN  -improved  -continue to hold ACE inhibitor and Lasix for now   CAD (coronary artery disease)  -Stable   Thrombocytopenia, unspecified  resolved   Anemia  -hgb stable   ?Seizure disorder   -Recently started on Tegretol for suspected focal seizures - has been on hold due to lethargy  -EEG without evidence of seizure . Repeat EEG without seizure like activity.    DVT prophylaxis: full dose Lovenox . Xarelto Code Status: Full  Family Communication: D/W son Shaun Whitney on 8/9. Disposition Plan: SNF when cleared by Neurology  Consultants:  Neurology  Interventional radiology.  Procedures:  EEG  LLE doppler LP 8/8  Antibiotics:  Zosyn 7/29 >> 8/1  8/4 >> 8/7 Vancomycin 8/4>> 8/7  HPI/Subjective:  No real change in MS over last 3-4 days. Says occasional word or two.  Objective: Filed Vitals:   10/11/12 1400  BP: 156/76  Pulse: 76  Temp: 98.2 F (36.8 C)  Resp: 18    Intake/Output Summary (Last 24 hours) at 10/11/12 1745 Last data filed at 10/11/12 1300  Gross per 24 hour  Intake   1065 ml  Output   1001 ml  Net     64 ml   Filed Weights   10/08/12 2137 10/09/12 2037 10/10/12 2008  Weight: 83.054 kg (183 lb 1.6 oz) 82.555 kg (182 lb) 83.87 kg (184 lb 14.4 oz)    Exam:   General: elderly frail male in NAD  CHEST: Clear to auscultation bilaterally. No increased work of breathing.  Cardiovascular: Regular rate and rhythm No murmur gallop or rub normal.  Abdomen: soft, Nontender, nondistended, soft, bowel sounds positive,  Musculoskeletal: warm, swelling over left leg with 2+ pitting edema  Neurological: Awake and occasionally follows few simple commands,Oriented only to self-will occasionally answer to questions in one-word . Left wrist drop. Mild increased warmth and tenderness of left wrist- seems slightly better today.   Data Reviewed: Basic Metabolic Panel:  Recent Labs Lab 10/05/12 0500 10/06/12 0500 10/07/12 1456 10/08/12 0500  NA 144 145 142 142  K 3.9 4.3 3.9 3.9  CL 110 113* 107 106  CO2 24 23 22 24   GLUCOSE 229* 187* 147* 153*  BUN 20 24* 19 20  CREATININE 1.42* 1.46* 1.21 1.18  CALCIUM 8.5 8.7 8.8 8.9   Liver Function  Tests: No results found for this basename: AST, ALT, ALKPHOS, BILITOT, PROT, ALBUMIN,  in the last 168 hours No results found for this basename: LIPASE, AMYLASE,  in the last 168 hours No results found for this basename: AMMONIA,  in the last 168 hours CBC:  Recent Labs Lab 10/05/12 0500 10/07/12 0400 10/10/12 0500  WBC 10.5 9.3 9.7  HGB 9.4* 9.5* 9.4*  HCT 27.4* 27.7* 27.4*  MCV 98.6 98.6 97.2  PLT 261 328 427*   Cardiac Enzymes: No results found for this basename: CKTOTAL, CKMB, CKMBINDEX, TROPONINI,  in the last 168 hours BNP (last 3 results) No results found for this basename: PROBNP,  in the last 8760 hours CBG:  Recent Labs Lab 10/10/12 1140 10/10/12 1616 10/10/12 2128 10/11/12 0725 10/11/12 1200  GLUCAP 219* 144* 216* 143* 215*    Recent Results (from the past 240 hour(s))  CULTURE, BLOOD (ROUTINE X 2)     Status: None   Collection Time    10/04/12  5:50 PM  Result Value Range Status   Specimen Description BLOOD RIGHT ARM   Final   Special Requests BOTTLES DRAWN AEROBIC AND ANAEROBIC 10CC   Final   Culture  Setup Time     Final   Value: 10/04/2012 22:52     Performed at [facility]   Culture     Final   Value: NO GROWTH 5 DAYS     Performed at [facility]   Report Status 10/10/2012 FINAL   Final  CULTURE, BLOOD (ROUTINE X 2)     Status: None   Collection Time    10/04/12  5:57 PM      Result Value Range Status   Specimen Description BLOOD RIGHT ARM   Final   Special Requests BOTTLES DRAWN AEROBIC AND ANAEROBIC 10CC   Final   Culture  Setup Time     Final   Value: 10/04/2012 22:53     Performed at [facility]   Culture     Final   Value: NO GROWTH 5 DAYS     Performed at [facility]   Report Status 10/10/2012 FINAL   Final  URINE CULTURE     Status: None   Collection Time    10/04/12  6:35 PM      Result Value Range Status   Specimen Description URINE, CLEAN CATCH   Final   Special Requests NONE   Final    Culture  Setup Time     Final   Value: 10/04/2012 20:43     Performed at [facility] Count     Final   Value: NO GROWTH     Performed at [facility]   Culture     Final   Value: NO GROWTH     Performed at [facility]   Report Status 10/06/2012 FINAL   Final  CSF CULTURE     Status: None   Collection Time    10/08/12  2:36 PM      Result Value Range Status   Specimen Description CSF   Final   Special Requests 5.5ML CSF FLUID   Final   Gram Stain     Final   Value: RARE WBC PRESENT, PREDOMINANTLY MONONUCLEAR     NO ORGANISMS SEEN     Performed at [facility]     Performed at [facility]   Culture     Final   Value: NO GROWTH 3 DAYS     Performed at [facility]   Report Status PENDING   Incomplete  GRAM STAIN     Status: None   Collection Time    10/08/12  2:36 PM      Result Value Range Status   Specimen Description CSF   Final   Special Requests 5.5ML CSF FLUID   Final   Gram Stain     Final   Value: CYTOSPIN PREP     WBC PRESENT, PREDOMINANTLY MONONUCLEAR     NO ORGANISMS SEEN   Report Status 10/08/2012 FINAL   Final  FUNGUS CULTURE W SMEAR     Status: None   Collection Time    10/08/12  2:36 PM      Result Value Range Status   Specimen Description CSF   Final   Special Requests 5.5ML FLUID   Final   Fungal Smear     Final   Value: NO YEAST OR FUNGAL ELEMENTS SEEN  Performed at [facility]     Final   Value: CULTURE IN PROGRESS FOR FOUR WEEKS     Performed at [facility]   Report Status PENDING   Incomplete     Studies: Dg Wrist Complete Left  11-02-2012   *RADIOLOGY REPORT*  Clinical Data: Left wrist pain  LEFT WRIST - COMPLETE 3+ VIEW  Comparison: None  Findings: There is no evidence of acute fracture, subluxation or dislocation. Mild degenerative changes of the radiocarpal and first carpometacarpal joints noted Vascular calcifications are present. No focal bony lesions  are identified.  IMPRESSION: No acute bony abnormalities.  Mild degenerative changes.   Original Report Authenticated By: Harmon Pier, M.D.    Scheduled Meds: . carvedilol  12.5 mg Oral BID  . feeding supplement  1 Container Oral TID BM  . insulin aspart  0-15 Units Subcutaneous TID WC  . insulin aspart  0-5 Units Subcutaneous QHS  . insulin detemir  15 Units Subcutaneous QHS  . methylPREDNISolone (SOLU-MEDROL) injection  1,000 mg Intravenous Daily  . rivaroxaban  15 mg Oral BID WC  . [START ON 10/22/2012] rivaroxaban  20 mg Oral Q supper  . senna  2 tablet Oral Daily  . sodium chloride  3 mL Intravenous Q12H   Continuous Infusions: . 0.9 % NaCl with KCl 20 mEq / L 75 mL/hr at 10/11/12 1021      Time spent:15 minutes    [facility]  [facility] Pager [phone] If 7PM-7AM, please contact night-coverage at [email or web address], password [facility] 10/11/2012, 5:45 PM  LOS: 13 days

## 2012-10-11 NOTE — Consult Note (Signed)
WOC follow-up: Sacral and buttocks wounds improved.  Refer to previous progress notes.  Currently, previous Deep tissue areas have evolved into 2 stage 2 wounds: one over sacrum 3X.2X.2cm, 100% red and moist, and another over left buttock 25X2.5X.1cm, 100% red and moist.  Right buttock pink and dry scar tissue.  No odor small pink drainage.  Difficult to keep area clean, pt frequently incontinent of stool.  Air mattress in place.  Continue present plan of care with foam dressing to protect and promote healing.   Please re-consult if further assistance is needed.  Thank-you,  Cammie Mcgee MSN, RN, CWOCN, [place], CNS [phone]

## 2012-10-11 NOTE — Progress Notes (Signed)
NUTRITION FOLLOW UP  Intervention:   1. Continue- Ensure Pudding po TID, each supplement provides 170 kcal and 4 grams of protein.    Nutrition Dx:   Malnutrition related to inadequate oral intake as evidenced by 12% weight loss in the past 3 months and severe depletion of muscle mass.   Goal:   Intake to meet >/=90% estimated nutrition needs. Unmet  Monitor:   PO intake, weight trends, labs   Assessment:   Pt admitted to [facility] from [facility] hospital with ongoing encephalopathy, likely related to hypernatremia and DVT. Seen by SLP and diet was advanced to Dysphagia 1, nectar thick liquids.   Pt remains encephalopathic, Neurology now involved.   Pt with increased needs to promote wound healing. WOC notes reviewed, sacral and buttocks wounds improving. Though deep tissue injuries have evolved into stage 2 wounds.   Pt unable to provide any additional information.  Lunch tray at bedside, untouched.   Height: Ht Readings from Last 1 Encounters:  10/10/12 5\' 9"  (1.753 m)    Weight Status:   Wt Readings from Last 1 Encounters:  10/10/12 184 lb 14.4 oz (83.87 kg)  consistent with admission wieght.   Re-estimated needs:  Kcal: 1900-2100 Protein: 115-130 gm  Fluid: 2-2.2 L   Skin: Stage 3 pressure ulcer on sacrum- improving.  Now with 2 stage 2 wounds on sacrum   Diet Order: Dysphagia 1, nectar thick liquids    Intake/Output Summary (Last 24 hours) at 10/11/12 1338 Last data filed at 10/11/12 0900  Gross per 24 hour  Intake   1245 ml  Output    750 ml  Net    495 ml    Last BM: frequently incontinent of stool per notes.    Labs:   Recent Labs Lab 10/06/12 0500 10/07/12 1456 10/08/12 0500  NA 145 142 142  K 4.3 3.9 3.9  CL 113* 107 106  CO2 23 22 24   BUN 24* 19 20  CREATININE 1.46* 1.21 1.18  CALCIUM 8.7 8.8 8.9  GLUCOSE 187* 147* 153*    CBG (last 3)   Recent Labs  10/10/12 2128 10/11/12 0725 10/11/12 1200  GLUCAP 216* 143* 215*    Scheduled  Meds: . carvedilol  12.5 mg Oral BID  . feeding supplement  1 Container Oral TID BM  . insulin aspart  0-15 Units Subcutaneous TID WC  . insulin aspart  0-5 Units Subcutaneous QHS  . insulin detemir  15 Units Subcutaneous QHS  . methylPREDNISolone (SOLU-MEDROL) injection  1,000 mg Intravenous Daily  . rivaroxaban  15 mg Oral BID WC  . [START ON 10/22/2012] rivaroxaban  20 mg Oral Q supper  . senna  2 tablet Oral Daily  . sodium chloride  3 mL Intravenous Q12H    Continuous Infusions: . 0.9 % NaCl with KCl 20 mEq / L 75 mL/hr at 10/11/12 1021      Clarene Duke RD, LDN Pager [phone] After Hours pager [phone]

## 2012-10-12 DIAGNOSIS — E119 Type 2 diabetes mellitus without complications: Secondary | ICD-10-CM | POA: Diagnosis present

## 2012-10-12 LAB — GLUCOSE, CAPILLARY
Glucose-Capillary: 239 mg/dL — ABNORMAL HIGH (ref 70–99)
Glucose-Capillary: 239 mg/dL — ABNORMAL HIGH (ref 70–99)
Glucose-Capillary: 471 mg/dL — ABNORMAL HIGH (ref 70–99)

## 2012-10-12 MED ORDER — INSULIN DETEMIR 100 UNIT/ML ~~LOC~~ SOLN
25.0000 [IU] | Freq: Every day | SUBCUTANEOUS | Status: DC
  Administered 2012-10-12 – 2012-10-14 (×3): 25 [IU] via SUBCUTANEOUS
  Filled 2012-10-12 (×5): qty 0.25

## 2012-10-12 MED ORDER — INSULIN ASPART 100 UNIT/ML ~~LOC~~ SOLN
0.0000 [IU] | Freq: Three times a day (TID) | SUBCUTANEOUS | Status: DC
  Administered 2012-10-13: 11 [IU] via SUBCUTANEOUS
  Administered 2012-10-13: 7 [IU] via SUBCUTANEOUS
  Administered 2012-10-13 – 2012-10-14 (×2): 11 [IU] via SUBCUTANEOUS
  Administered 2012-10-14: 4 [IU] via SUBCUTANEOUS

## 2012-10-12 MED ORDER — INSULIN ASPART 100 UNIT/ML ~~LOC~~ SOLN
3.0000 [IU] | Freq: Three times a day (TID) | SUBCUTANEOUS | Status: DC
  Administered 2012-10-12 (×2): 3 [IU] via SUBCUTANEOUS

## 2012-10-12 MED ORDER — INSULIN ASPART 100 UNIT/ML ~~LOC~~ SOLN
6.0000 [IU] | Freq: Three times a day (TID) | SUBCUTANEOUS | Status: DC
  Administered 2012-10-13 – 2012-10-15 (×8): 6 [IU] via SUBCUTANEOUS

## 2012-10-12 MED ORDER — INSULIN ASPART 100 UNIT/ML ~~LOC~~ SOLN
10.0000 [IU] | Freq: Once | SUBCUTANEOUS | Status: AC
  Administered 2012-10-12: 10 [IU] via SUBCUTANEOUS

## 2012-10-12 MED ORDER — INSULIN ASPART 100 UNIT/ML ~~LOC~~ SOLN
0.0000 [IU] | Freq: Every day | SUBCUTANEOUS | Status: DC
  Administered 2012-10-13: 3 [IU] via SUBCUTANEOUS

## 2012-10-12 NOTE — Progress Notes (Addendum)
[facility] PROGRESS NOTE  SEMAJ COBURN WUJ:[id number] DOB: 1931/02/13 DOA: 09/28/2012 PCP: Kirstie Peri, MD  Brief narrative  79 year old male who was recently hospitalized at [facility] on 09/09/2012 for confusion and generalized weakness.During that admission, it was felt the patient may have been having focal seizures and he was started on Tegretol . He was discharged to a SNF. Two weeks later patient developed increasing lethargy and poor oral intake. He was readmitted to [facility] on 09/26/2012. 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 He was found to be in acute renal failure with hypernatremia and was hydrated. He remained confused and encephalopathic. Family spoke with the patient's primary care physician and requested the patient be transferred to [facility]. Neurology consulted and are following patient. Patient has had extensive workup including MRI brain, EEG and LP. Etiology not revealing thus far. Based on neurology recommendations, started pulse IV steroids on 10/11/12 for 5 days for possible autoimmune encephalitis and neurology recommends continued inpatient care.  Assessment/Plan:   Encephalopathy ? Chronic- unclear etiology/? Autoimmune encephalitis - Initially thought due to hypernatremia and fever with UTI  - prior MRI reported as negative  - EEG suggests encephalopathy, no evidence of seizure activity or foci  - Patient continues to be encephalopathic. Also had a fever on 8/4. Fever workup so far negative. He does seem to have some left-sided weakness on exam however difficult to assess given his encephalopathy. Repeat head CT which is unremarkable except for chronic small vessel disease. TSH, RPR, B12 wnl - Repeat MRI brain and C-spine without acute findings.  - Repeat EEG does not show seizure like activity. Gen. background slowing. - Had LP 8/8:1 WBC, normal protein and cryptococcal antigen negative. CSF culture, fungal culture, VDRL, B.  Burgdorfi antibodies, HSV, IgG index, oligoclonal bands, beta isoform are in process. - Started IV Solu-Medrol on 8/11x5 days. Mental status definitely looks better today. Neurology requesting autoimmune panel.  Left wrist drop/left hemiparesis - Noted on exam 8/7.  Unclear of duration of this symptoms. Wrist pain has resolved after steroids.  - Uric acid: 2.9. - MRI C-spine shows advanced cervical spine degenerative changes, mild cervical spinal stenosis at 2-3, otherwise moderate to severe foramina stenosis. Not sure if these are contributing to the wrist drop- discussed with Neurology and will see today. - ? Gout/OA flare - trial of Prednisone. No NSAID's due to anticoagulation or colchicine due to recent AKI  - Will check an X Ray of wrist- negative  Fever/leukocytosis  -UA positive but urine culture negative  - abx discontinued ( zosyn) but was resumed on 8/4 along with vanco as patient became febrile again to 102.56F. Blood cx negative. CXR negative for infiltrate  - No clear source of infection. Antibiotics discontinued. - ? Fever secondary to DVT. Resolved.  Dehydration with hypernatremia  -resolved with IV fluids  -was on Lasix prior to admission   LLE edema/acute DVT  - venous duplex shows extensive DVT LLE  - on full dose Lovenox since 7/31 .  -switched to Xarelto- monitor if taking  Acute renal failure on CKD (chronic kidney disease) stage 3, GFR 30-59 ml/min  -baseline Scr 1.18 with GFR around 60  -likely due to dehydration - granular cast in UA suggest ATN as well  -[place] normal  - Resolved.  Chronic diastolic congestive heart failure  -Follows with [facility]  -euvolemic at present  - Clinically compensated.  Hypokalemia  -relpenished   Stage 2 sacral decubitus  -appreciate  Wound RN eval & FU. -  Mild Dysphagia  -SLP eval rec D1 diet w/ nectar liquids  - [employer] food. - ate 75% breakfast on 8/10  DM -Last A1c 6.5  - fsg stable   continue levemir -  increased dose slightly due to solumedrol and SSI - CBG 434 this pm- due to steroids. Will increase Levemir to 25, SSI to resistant and meal novolog to 6 units. These will have to be reduced when he comes off steroids.  HTN  -improved  -continue to hold ACE inhibitor and Lasix for now   CAD (coronary artery disease)  -Stable   Thrombocytopenia, unspecified  resolved   Anemia  -hgb stable   ?Seizure disorder  -Recently started on Tegretol for suspected focal seizures - has been on hold due to lethargy  -EEG without evidence of seizure . Repeat EEG without seizure like activity.    DVT prophylaxis: full dose Lovenox > Xarelto Code Status: Full  Family Communication: D/W Mr. Aldrin Engelhard via phone. Disposition Plan: SNF when cleared by Neurology  Consultants:  Neurology  Interventional radiology.  Procedures:  EEG  LLE doppler LP 8/8  Antibiotics:  Zosyn 7/29 >> 8/1  8/4 >> 8/7 Vancomycin 8/4>> 8/7  HPI/Subjective:  More talkative. Denies pain in left wrist. "I'm good".   Objective: Filed Vitals:   10/12/12 1315  BP: 140/78  Pulse: 79  Temp: 98.2 F (36.8 C)  Resp: 18    Intake/Output Summary (Last 24 hours) at 10/12/12 1704 Last data filed at 10/12/12 1300  Gross per 24 hour  Intake   1665 ml  Output   2350 ml  Net   -685 ml   Filed Weights   10/09/12 2037 10/10/12 2008 10/11/12 2200  Weight: 82.555 kg (182 lb) 83.87 kg (184 lb 14.4 oz) 83.9 kg (184 lb 15.5 oz)    Exam:   General: elderly frail male in NAD  CHEST: Clear to auscultation bilaterally. No increased work of breathing.  Cardiovascular: Regular rate and rhythm No murmur gallop or rub normal.  Abdomen: soft, Nontender, nondistended, soft, bowel sounds positive,  Musculoskeletal: warm, swelling over left leg with 2+ pitting edema  Neurological: Awake and alert. Oriented only to self. Follows more commands today- open eyes, stick out tongue, lift up arms. Left wrist drop, nonsustained  clonus. Swelling, tenderness have resolved.  Data Reviewed: Basic Metabolic Panel:  Recent Labs Lab 10/06/12 0500 10/07/12 1456 10/08/12 0500  NA 145 142 142  K 4.3 3.9 3.9  CL 113* 107 106  CO2 23 22 24   GLUCOSE 187* 147* 153*  BUN 24* 19 20  CREATININE 1.46* 1.21 1.18  CALCIUM 8.7 8.8 8.9   Liver Function Tests: No results found for this basename: AST, ALT, ALKPHOS, BILITOT, PROT, ALBUMIN,  in the last 168 hours No results found for this basename: LIPASE, AMYLASE,  in the last 168 hours No results found for this basename: AMMONIA,  in the last 168 hours CBC:  Recent Labs Lab 10/07/12 0400 10/10/12 0500  WBC 9.3 9.7  HGB 9.5* 9.4*  HCT 27.7* 27.4*  MCV 98.6 97.2  PLT 328 427*   Cardiac Enzymes: No results found for this basename: CKTOTAL, CKMB, CKMBINDEX, TROPONINI,  in the last 168 hours BNP (last 3 results) No results found for this basename: PROBNP,  in the last 8760 hours CBG:  Recent Labs Lab 10/11/12 1200 10/11/12 1627 10/11/12 2149 10/12/12 0752 10/12/12 1154  GLUCAP 215* 153* 169* 239* 239*  Recent Results (from the past 240 hour(s))  CULTURE, BLOOD (ROUTINE X 2)     Status: None   Collection Time    10/04/12  5:50 PM      Result Value Range Status   Specimen Description BLOOD RIGHT ARM   Final   Special Requests BOTTLES DRAWN AEROBIC AND ANAEROBIC 10CC   Final   Culture  Setup Time     Final   Value: 10/04/2012 22:52     Performed at [facility]   Culture     Final   Value: NO GROWTH 5 DAYS     Performed at [facility]   Report Status 10/10/2012 FINAL   Final  CULTURE, BLOOD (ROUTINE X 2)     Status: None   Collection Time    10/04/12  5:57 PM      Result Value Range Status   Specimen Description BLOOD RIGHT ARM   Final   Special Requests BOTTLES DRAWN AEROBIC AND ANAEROBIC 10CC   Final   Culture  Setup Time     Final   Value: 10/04/2012 22:53     Performed at [facility]   Culture     Final   Value: NO  GROWTH 5 DAYS     Performed at [facility]   Report Status 10/10/2012 FINAL   Final  URINE CULTURE     Status: None   Collection Time    10/04/12  6:35 PM      Result Value Range Status   Specimen Description URINE, CLEAN CATCH   Final   Special Requests NONE   Final   Culture  Setup Time     Final   Value: 10/04/2012 20:43     Performed at [facility] Count     Final   Value: NO GROWTH     Performed at [facility]   Culture     Final   Value: NO GROWTH     Performed at [facility]   Report Status 10/06/2012 FINAL   Final  CSF CULTURE     Status: None   Collection Time    10/08/12  2:36 PM      Result Value Range Status   Specimen Description CSF   Final   Special Requests 5.5ML CSF FLUID   Final   Gram Stain     Final   Value: RARE WBC PRESENT, PREDOMINANTLY MONONUCLEAR     NO ORGANISMS SEEN     Performed at [facility]     Performed at [facility]   Culture     Final   Value: NO GROWTH 3 DAYS     Performed at [facility]   Report Status 10/12/2012 FINAL   Final  GRAM STAIN     Status: None   Collection Time    10/08/12  2:36 PM      Result Value Range Status   Specimen Description CSF   Final   Special Requests 5.5ML CSF FLUID   Final   Gram Stain     Final   Value: CYTOSPIN PREP     WBC PRESENT, PREDOMINANTLY MONONUCLEAR     NO ORGANISMS SEEN   Report Status 10/08/2012 FINAL   Final  FUNGUS CULTURE W SMEAR     Status: None   Collection Time    10/08/12  2:36 PM      Result Value Range Status  Specimen Description CSF   Final   Special Requests 5.5ML FLUID   Final   Fungal Smear     Final   Value: NO YEAST OR FUNGAL ELEMENTS SEEN     Performed at [facility]   Culture     Final   Value: CULTURE IN PROGRESS FOR FOUR WEEKS     Performed at [facility]   Report Status PENDING   Incomplete     Studies: No results found.  Scheduled Meds: . carvedilol  12.5 mg Oral BID   . feeding supplement  1 Container Oral TID BM  . insulin aspart  0-15 Units Subcutaneous TID WC  . insulin aspart  0-5 Units Subcutaneous QHS  . insulin aspart  3 Units Subcutaneous TID WC  . insulin detemir  15 Units Subcutaneous QHS  . methylPREDNISolone (SOLU-MEDROL) injection  1,000 mg Intravenous Daily  . rivaroxaban  15 mg Oral BID WC  . [START ON 10/22/2012] rivaroxaban  20 mg Oral Q supper  . senna  2 tablet Oral Daily  . sodium chloride  3 mL Intravenous Q12H   Continuous Infusions: . 0.9 % NaCl with KCl 20 mEq / L 75 mL/hr at 10/12/12 1406      Time spent:25 minutes    [facility]  [facility] Pager [phone] If 7PM-7AM, please contact night-coverage at [email or web address], password [facility] 10/12/2012, 5:04 PM  LOS: 14 days

## 2012-10-12 NOTE — Progress Notes (Signed)
Subjective: Some improvement in alertness.   Exam: Filed Vitals:   10/12/12 0436  BP: 144/72  Pulse: 73  Temp: 98.4 F (36.9 C)  Resp: 18   Gen: In bed, NAD MS: In bed, NAD. Follows more commands today, engages examiner, responds with his name, but not location. 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  Sensory: Intact to light touch DTR: clonus in left wrist, supporting UMN nature for wrist weakness  Impression: 79 year old male with progressive encephalopathy over the past couple of months. Thyroid antibodies are negative. This time his workup was done predominantly negative, without explanation for his progressive encephalopathy. Given the density of his encephalopathy, I am concerned for an autoimmune cause.  A pulsed dose of steroids may be worthwhile as empiric treatment, though I would not pursue further treatment unless he were to have a clear diagnosis.  Recommendations: 1) autoimmune encephalitis panel including voltage-gated potassium channel to be sent 2) Solumedrol 1gm qday x 5 days 3) will await autoimmune panel as well as protein 14 33 from CSF  Ritta Slot, MD [facility] [phone]  If 7pm- 7am, please page neurology on call at [phone].

## 2012-10-12 NOTE — Progress Notes (Signed)
10/12/2012 1741 Blood sugar was 424.novolog 15 units was given at 1759  Plus 3 units of meal coverage. Dr Waymon Amato is aware. Will continue to monitor patient. [facility] RN.

## 2012-10-12 NOTE — Progress Notes (Signed)
SNF bed at [facility] is anticipated- awaiting d/c date- family aware and hopeful for this plan-  Reece Levy, MSW [phone]

## 2012-10-12 NOTE — Progress Notes (Addendum)
Speech Language Pathology Dysphagia Treatment Patient Details Name: NAYEF COLLEGE MRN: [id number] DOB: [birth year] Today's Date: 10/12/2012 Time: 9562-1308 SLP Time Calculation (min): 15 min  Assessment / Plan / Recommendation Clinical Impression  Diagnostic treatment completed focusing on diet tolerance and advancement.  Currently, on conservative diet of dysphagia 1 (puree) and Nectar thick liquids.  Dramatic improvement in mental status this date with improved ability to follow verbal cues.  Prolonged oral phase with trial ice chips with min verbal cues required to initiate swallow.  Immediate throat clear noted before swallow due to delay .   No outward s/s of aspiration noted with trials of thin water by cup.  Recommend to continue current diet with full assist although improved in overall function of the swallow patient  continues with decreased sustained attention with continued oral holding with PO's.  ST to continue in acute care setting for diet tolerance and possible advancement.    Recommend objective evaluation prior to diet advancement.        Diet Recommendation  Continue with Current Diet: Dysphagia 1 (puree);Nectar-thick liquid    SLP Plan Continue with current plan of care      Swallowing Goals  SLP Swallowing Goals Swallow Study Goal #2 - Progress: Progressing toward goal  General Temperature Spikes Noted: No Respiratory Status: Supplemental O2 delivered via (comment) (per nasal cannula ) Behavior/Cognition: Alert;Cooperative;Pleasant mood;Distractible;Requires cueing Oral Cavity - Dentition: Poor condition Patient Positioning: Upright in bed  Oral Cavity - Oral Hygiene Does patient have any of the following "at risk" factors?: Oxygen therapy - cannula, mask, simple oxygen devices;Diet - patient on thickened liquids;Other - dysphagia Patient is HIGH RISK - Oral Care Protocol followed (see row info): Yes Patient is AT RISK - Oral Care Protocol followed (see row info):  Yes   Dysphagia Treatment Treatment focused on: Skilled observation of diet tolerance;Upgraded PO texture trials;Facilitation of oral phase;Facilitation of pharyngeal phase;Utilization of compensatory strategies Treatment Methods/Modalities: Skilled observation;Differential diagnosis Patient observed directly with PO's: Yes Type of PO's observed: Thin liquids;Ice chips Feeding: Able to feed self;Needs assist Liquids provided via: Teaspoon;Cup Oral Phase Signs & Symptoms: Prolonged oral phase Pharyngeal Phase Signs & Symptoms: Suspected delayed swallow initiation;Immediate cough Type of cueing: Verbal;Tactile Amount of cueing: Minimal   GO    [name] MS, CCC-SLP [phone] [facility] 10/12/2012, 12:03 PM

## 2012-10-13 DIAGNOSIS — I509 Heart failure, unspecified: Secondary | ICD-10-CM

## 2012-10-13 DIAGNOSIS — G912 (Idiopathic) normal pressure hydrocephalus: Secondary | ICD-10-CM

## 2012-10-13 DIAGNOSIS — I5042 Chronic combined systolic (congestive) and diastolic (congestive) heart failure: Secondary | ICD-10-CM

## 2012-10-13 LAB — GLUCOSE, CAPILLARY
Glucose-Capillary: 296 mg/dL — ABNORMAL HIGH (ref 70–99)
Glucose-Capillary: 278 mg/dL — ABNORMAL HIGH (ref 70–99)
Glucose-Capillary: 203 mg/dL — ABNORMAL HIGH (ref 70–99)
Glucose-Capillary: 266 mg/dL — ABNORMAL HIGH (ref 70–99)

## 2012-10-13 LAB — CBC
HCT: 28.4 % — ABNORMAL LOW (ref 39.0–52.0)
Hemoglobin: 9.8 g/dL — ABNORMAL LOW (ref 13.0–17.0)
MCH: 32.9 pg (ref 26.0–34.0)
MCHC: 34.5 g/dL (ref 30.0–36.0)
MCV: 95.3 fL (ref 78.0–100.0)
RDW: 13.7 % (ref 11.5–15.5)

## 2012-10-13 LAB — CSF IGG: IgG, CSF: 2 mg/dL (ref 0.8–7.7)

## 2012-10-13 NOTE — Progress Notes (Signed)
[facility] PROGRESS NOTE  Shaun Whitney ZOX:[id number] DOB: [birth year] DOA: 09/28/2012 PCP: Kirstie Peri, MD  Assessment/Plan: 1. Acute encephalitis; per neurology; autoimmune encephalitis panel including voltage-gated potassium channel Pending;  autoimmune panel as well as protein 14 33 from CSF Pending, continue Solumedrol 1gm qday x 5 days.   2. 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   3. HTN; slightly high with considering patient's age and mental status would not lower  4. CHF; stable, patient not complaining of any chest pains, patient is negative total I/O. we'll obtain daily a.m. Weights  5. LLE DVT; continue Xarelto  6. ARF; resolved patient's creatinine= 0.87    Code Status: Full Family Communication: Spoke with son counseled him on plan of care Disposition Plan: Per neurology   Consultants:  Neurology  Procedures:  CT head without contrast 10/06/2012 Motion degraded exam.  No intracranial hemorrhage.  Small vessel disease type changes without CT findings of large  acute infarct.  Global atrophy. Ventricular prominence may be related to atrophy  although difficult to exclude a mild component hydrocephalus.     Antibiotics:    HPI/Subjective: 79 yo WM PMHx congestive heart failure, acute encephalopathy, CK-MB stage III, DM, hypernatremia, HTN, DVT, was recently hospitalized at [facility] on 09/09/2012 for confusion and generalized weakness.During that admission, it was felt the patient may have been having focal seizures and he was started on Tegretol . He was discharged to a SNF. Two weeks later patient developed increasing lethargy and poor oral intake. He was readmitted to [facility] on 09/26/2012. 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 He was found to be in acute  renal failure with hypernatremia and was hydrated. He remained confused and encephalopathic. Family spoke with the patient's primary care physician and requested the patient be transferred to [facility]. Neurology consulted and are following patient. Patient has had extensive workup including MRI brain, EEG and LP. Etiology not revealing thus far. Based on neurology recommendations, started pulse IV steroids on 10/11/12 for 5 days for possible autoimmune encephalitis and neurology recommends continued inpatient care.      Objective: Filed Vitals:   10/12/12 1315 10/12/12 1715 10/12/12 2247 10/13/12 0431  BP: 140/78 149/76 162/83 147/82  Pulse: 79 92 88 83  Temp: 98.2 F (36.8 C) 97.5 F (36.4 C) 98.2 F (36.8 C) 98.5 F (36.9 C)  TempSrc: Oral Oral Oral Oral  Resp: 18  14 16   Height:      Weight:      SpO2: 100% 97% 98% 99%    Intake/Output Summary (Last 24 hours) at 10/13/12 0827 Last data filed at 10/13/12 0720  Gross per 24 hour  Intake    900 ml  Output   2600 ml  Net  -1700 ml   Filed Weights   10/09/12 2037 10/10/12 2008 10/11/12 2200  Weight: 82.555 kg (182 lb) 83.87 kg (184 lb 14.4 oz) 83.9 kg (184 lb 15.5 oz)    Exam:   General:  Alert, NAD  Cardiovascular: Regular rhythm and rate, systolic murmur, DP/PT +1 bilateral  Respiratory: Good auscultation bilateral  Abdomen: Soft nontender nondistended plus bowel sounds  Musculoskeletal: Bilateral pedal edema   Data Reviewed: Basic Metabolic Panel:  Recent Labs Lab 10/07/12 1456 10/08/12 0500 10/13/12 0400  NA 142 142  --  K 3.9 3.9  --   CL 107 106  --   CO2 22 24  --   GLUCOSE 147* 153*  --   BUN 19 20  --   CREATININE 1.21 1.18 0.87  CALCIUM 8.8 8.9  --    Liver Function Tests: No results found for this basename: AST, ALT, ALKPHOS, BILITOT, PROT, ALBUMIN,  in the last 168 hours No results found for this basename: LIPASE, AMYLASE,  in the last 168 hours No results found for this basename:  AMMONIA,  in the last 168 hours CBC:  Recent Labs Lab 10/07/12 0400 10/10/12 0500 10/13/12 0400  WBC 9.3 9.7 7.9  HGB 9.5* 9.4* 9.8*  HCT 27.7* 27.4* 28.4*  MCV 98.6 97.2 95.3  PLT 328 427* 495*   Cardiac Enzymes: No results found for this basename: CKTOTAL, CKMB, CKMBINDEX, TROPONINI,  in the last 168 hours BNP (last 3 results) No results found for this basename: PROBNP,  in the last 8760 hours CBG:  Recent Labs Lab 10/12/12 0752 10/12/12 1154 10/12/12 1741 10/12/12 2152 10/13/12 0804  GLUCAP 239* 239* 424* 471* 296*    Recent Results (from the past 240 hour(s))  CULTURE, BLOOD (ROUTINE X 2)     Status: None   Collection Time    10/04/12  5:50 PM      Result Value Range Status   Specimen Description BLOOD RIGHT ARM   Final   Special Requests BOTTLES DRAWN AEROBIC AND ANAEROBIC 10CC   Final   Culture  Setup Time     Final   Value: 10/04/2012 22:52     Performed at [facility]   Culture     Final   Value: NO GROWTH 5 DAYS     Performed at [facility]   Report Status 10/10/2012 FINAL   Final  CULTURE, BLOOD (ROUTINE X 2)     Status: None   Collection Time    10/04/12  5:57 PM      Result Value Range Status   Specimen Description BLOOD RIGHT ARM   Final   Special Requests BOTTLES DRAWN AEROBIC AND ANAEROBIC 10CC   Final   Culture  Setup Time     Final   Value: 10/04/2012 22:53     Performed at [facility]   Culture     Final   Value: NO GROWTH 5 DAYS     Performed at [facility]   Report Status 10/10/2012 FINAL   Final  URINE CULTURE     Status: None   Collection Time    10/04/12  6:35 PM      Result Value Range Status   Specimen Description URINE, CLEAN CATCH   Final   Special Requests NONE   Final   Culture  Setup Time     Final   Value: 10/04/2012 20:43     Performed at [facility] Count     Final   Value: NO GROWTH     Performed at [facility]   Culture     Final   Value: NO GROWTH      Performed at [facility]   Report Status 10/06/2012 FINAL   Final  CSF CULTURE     Status: None   Collection Time    10/08/12  2:36 PM      Result Value Range Status   Specimen Description CSF   Final   Special Requests 5.5ML CSF FLUID  Final   Gram Stain     Final   Value: RARE WBC PRESENT, PREDOMINANTLY MONONUCLEAR     NO ORGANISMS SEEN     Performed at [facility]     Performed at [facility]   Culture     Final   Value: NO GROWTH 3 DAYS     Performed at [facility]   Report Status 10/12/2012 FINAL   Final  GRAM STAIN     Status: None   Collection Time    10/08/12  2:36 PM      Result Value Range Status   Specimen Description CSF   Final   Special Requests 5.5ML CSF FLUID   Final   Gram Stain     Final   Value: CYTOSPIN PREP     WBC PRESENT, PREDOMINANTLY MONONUCLEAR     NO ORGANISMS SEEN   Report Status 10/08/2012 FINAL   Final  FUNGUS CULTURE W SMEAR     Status: None   Collection Time    10/08/12  2:36 PM      Result Value Range Status   Specimen Description CSF   Final   Special Requests 5.5ML FLUID   Final   Fungal Smear     Final   Value: NO YEAST OR FUNGAL ELEMENTS SEEN     Performed at [facility]   Culture     Final   Value: CULTURE IN PROGRESS FOR FOUR WEEKS     Performed at [facility]   Report Status PENDING   Incomplete     Studies: No results found.  Scheduled Meds: . carvedilol  12.5 mg Oral BID  . feeding supplement  1 Container Oral TID BM  . insulin aspart  0-20 Units Subcutaneous TID WC  . insulin aspart  0-5 Units Subcutaneous QHS  . insulin aspart  6 Units Subcutaneous TID WC  . insulin detemir  25 Units Subcutaneous QHS  . methylPREDNISolone (SOLU-MEDROL) injection  1,000 mg Intravenous Daily  . rivaroxaban  15 mg Oral BID WC  . [START ON 10/22/2012] rivaroxaban  20 mg Oral Q supper  . senna  2 tablet Oral Daily  . sodium chloride  3 mL Intravenous Q12H   Continuous Infusions: .  0.9 % NaCl with KCl 20 mEq / L 75 mL/hr at 10/13/12 0431    Active Problems:   HTN (hypertension)   Chronic combined systolic and diastolic congestive heart failure   CAD (coronary artery disease)   Acute encephalopathy   Hypokalemia   Dehydration with hypernatremia   Fever   CKD (chronic kidney disease) stage 3, GFR 30-59 ml/min   Acute renal failure   Thrombocytopenia, unspecified   Anemia   UTI (urinary tract infection)   DVT (deep venous thrombosis)   Fever, unspecified   Left wrist pain- ? Gout. also Wrist drop.   Diabetes mellitus    Time spent: 50 minutes   Drema Dallas  [facility] Pager [phone] If 7PM-7AM, please contact night-coverage at [email or web address], password [facility] 10/13/2012, 8:27 AM  LOS: 15 days

## 2012-10-13 NOTE — Progress Notes (Signed)
Speech Language Pathology Dysphagia Treatment Patient Details Name: Shaun Whitney MRN: [id number] DOB: December 05, 1930 Today's Date: 10/13/2012 Time: 7829-5621 SLP Time Calculation (min): 0 min  Assessment / Plan / Recommendation Clinical Impression  Diagnostic treatment completed focusing on diet tolerance of dysphagia 1 and nectar thick liquids and for possible diet advancement.  Improvement in area of oral prep and oral phase with adequate mastication with trial ice chips requiring min verbal and tactile cues to continue oral prep stage.  No oral holding noted with puree trials.  Due to noted improvement in cognition and  the swallow recommend to proceed with objective evaluation of MBS to assess risk for aspiration and recommend safest, PO diet.     Diet Recommendation  Continue with Current Diet: Dysphagia 1 (puree);Nectar-thick liquid    SLP Plan MBS;New goals to be determined pending objective testing      Swallowing Goals   New goals pending results of MBS  General Temperature Spikes Noted: No Respiratory Status: Supplemental O2 delivered via (comment) (via nasal cannula) Behavior/Cognition: Alert;Cooperative;Pleasant mood;Distractible;Requires cueing Oral Cavity - Dentition: Poor condition;Missing dentition Patient Positioning: Upright in bed  Oral Cavity - Oral Hygiene Does patient have any of the following "at risk" factors?: Oxygen therapy - cannula, mask, simple oxygen devices;Diet - patient on thickened liquids;Other - dysphagia Patient is HIGH RISK - Oral Care Protocol followed (see row info): Yes Patient is AT RISK - Oral Care Protocol followed (see row info): Yes   Dysphagia Treatment Treatment focused on: Skilled observation of diet tolerance;Upgraded PO texture trials;Facilitation of oral preparatory phase;Facilitation of oral phase;Facilitation of pharyngeal phase;Utilization of compensatory strategies Treatment Methods/Modalities: Skilled observation;Differential  diagnosis Patient observed directly with PO's: Yes Type of PO's observed: Ice chips;Dysphagia 1 (puree) Feeding: Needs assist;Needs set up;Able to feed self Oral Phase Signs & Symptoms: Prolonged oral phase Type of cueing: Verbal;Tactile Amount of cueing: Minimal   GO    Shaun Fowler MS, CCC-SLP [phone] [facility] 10/13/2012, 11:00 AM

## 2012-10-13 NOTE — Progress Notes (Signed)
Physical Therapy Treatment Patient Details Name: Shaun Whitney MRN: [id number] DOB: May 26, 1930 Today's Date: 10/13/2012 Time: 1478-2956 PT Time Calculation (min): 30 min  PT Assessment / Plan / Recommendation  History of Present Illness Pt admit with acute encephalopathy.   PT Comments   Pt continues to have cognitive deficits however able to better participate today with constant multimodal cues.  Follow Up Recommendations  SNF;Supervision/Assistance - 24 hour     Does the patient have the potential to tolerate intense rehabilitation     Barriers to Discharge        Equipment Recommendations  None recommended by PT    Recommendations for Other Services    Frequency     Progress towards PT Goals Progress towards PT goals: Progressing toward goals  Plan Current plan remains appropriate    Precautions / Restrictions Precautions Precautions: Fall   Pertinent Vitals/Pain N/a, no signs of distress upon leaving room    Mobility  Bed Mobility Bed Mobility: Rolling Right;Rolling Left;Scooting to [facility] Rolling Right: 2: Max assist;With rail Rolling Left: 1: +1 Total assist Right Sidelying to Sit: With rails Scooting to HOB: 1: +1 Total assist Details for Bed Mobility Assistance: pt given multimodal cues and time to assist with bed mobility, guided UEs to rails with rolling and pt able to assist more with upper body rolling to Right side, pt attempted scooted up HOB in trendelenberg however quickly stopped pulling requiring assist Transfers Transfers: Not assessed    Exercises General Exercises - Upper Extremity Elbow Flexion: AROM;Limitations;5 reps;Both Elbow Flexion Limitations: AAROM for L  Elbow Extension: AROM;Limitations;5 reps;Both Elbow Extension Limitations: AAROM for L  Wrist Flexion: AROM;Both;Limitations;5 reps Wrist Flexion Limitations: limited AROM on L  Wrist Extension: AROM;Both;Limitations;5 reps Wrist Extension Limitations: limited AROM on L  Digit  Composite Flexion: AROM;5 reps;Limitations Composite Flexion Limitations: limited AROM on L and poor grip on L Composite Extension: AROM;5 reps;Limitations Composite Extension Limitations: limited AROM on L  General Exercises - Lower Extremity Ankle Circles/Pumps: AROM;10 reps;Both;Supine Quad Sets: AROM;5 reps;Right;Limitations;Supine Quad Sets Limitations: unable to perform on L (?cognition or ability) Short Arc Quad: AAROM;Both;10 reps;Supine Heel Slides: AAROM;Both;Supine;10 reps Hip ABduction/ADduction: AAROM;5 reps;Both;Supine   PT Diagnosis:    PT Problem List:   PT Treatment Interventions:     PT Goals (current goals can now be found in the care plan section) Acute Rehab PT Goals PT Goal Formulation: Patient unable to participate in goal setting Time For Goal Achievement: 10/27/12 Potential to Achieve Goals: Fair  Visit Information  Last PT Received On: 10/13/12 Assistance Needed: +2 History of Present Illness: Pt admit with acute encephalopathy.    Subjective Data      Cognition  Cognition Arousal/Alertness: Awake/alert Behavior During Therapy: Flat affect Overall Cognitive Status: Impaired/Different from baseline Area of Impairment: Attention;Orientation;Memory;Following commands;Safety/judgement;Awareness;Problem solving Orientation Level: Disoriented to;Place;Time;Situation Memory: Decreased short-term memory Following Commands: Follows one step commands inconsistently Safety/Judgement: Decreased awareness of deficits Problem Solving: Slow processing;Decreased initiation;Requires verbal cues;Requires tactile cues;Difficulty sequencing    Balance     End of Session PT - End of Session Activity Tolerance: Patient limited by fatigue;Other (comment) (limited by cognition) Patient left: in bed;with call bell/phone within reach;with bed alarm set   GP     Almer Littleton,KATHrine E 10/13/2012, 1:16 PM Zenovia Jarred, PT, DPT 10/13/2012 Pager: [phone]

## 2012-10-13 NOTE — Progress Notes (Signed)
Inpatient Diabetes Program Recommendations  AACE/ADA: New Consensus Statement on Inpatient Glycemic Control (2013)  Target Ranges:  Prepandial:   less than 140 mg/dL      Peak postprandial:   less than 180 mg/dL (1-2 hours)      Critically ill patients:  140 - 180 mg/dL   Hyperglycemia while on Solumedrol Rx Noted addition of meal coverage, thank you.  Inpatient Diabetes Program Recommendations Insulin - Basal: Pt appears to need an increase in the levemir as well. Increae to 30 units (by 5 unit increments until fastings are controlled)  Thank you, Lenor Coffin, RN, CNS, Diabetes Coordinator [phone])

## 2012-10-14 ENCOUNTER — Inpatient Hospital Stay (HOSPITAL_COMMUNITY): Payer: Medicare Other

## 2012-10-14 LAB — GLUCOSE, CAPILLARY
Glucose-Capillary: 178 mg/dL — ABNORMAL HIGH (ref 70–99)
Glucose-Capillary: 261 mg/dL — ABNORMAL HIGH (ref 70–99)
Glucose-Capillary: 280 mg/dL — ABNORMAL HIGH (ref 70–99)

## 2012-10-14 MED ORDER — METFORMIN HCL 500 MG PO TABS
500.0000 mg | ORAL_TABLET | Freq: Two times a day (BID) | ORAL | Status: DC
  Administered 2012-10-14 – 2012-10-15 (×2): 500 mg via ORAL
  Filled 2012-10-14 (×4): qty 1

## 2012-10-14 MED ORDER — INSULIN ASPART 100 UNIT/ML ~~LOC~~ SOLN
0.0000 [IU] | SUBCUTANEOUS | Status: DC
  Administered 2012-10-14: 5 [IU] via SUBCUTANEOUS
  Administered 2012-10-14: 8 [IU] via SUBCUTANEOUS
  Administered 2012-10-15: 3 [IU] via SUBCUTANEOUS
  Administered 2012-10-15 (×3): 8 [IU] via SUBCUTANEOUS

## 2012-10-14 NOTE — Procedures (Signed)
Objective Swallowing Evaluation: Modified Barium Swallowing Study  Patient Details  Name: Shaun Whitney MRN: [id number] Date of Birth: 1930/11/15  Today's Date: 10/14/2012 Time: 4098-1191 SLP Time Calculation (min): 25 min  Past Medical History:  Past Medical History  Diagnosis Date   Aortic stenosis    Diabetes mellitus type II    Hypertension    Gout    GERD (gastroesophageal reflux disease)    Past Surgical History:  Past Surgical History  Procedure Laterality Date   Joint replacement      right knee   Tee without cardioversion  01/15/2012    Procedure: TRANSESOPHAGEAL ECHOCARDIOGRAM (TEE);  Surgeon: Laurey Morale, MD;  Location: [facility] ENDOSCOPY;  Service: Cardiovascular;  Laterality: N/A;   HPI:  79 y.o. male with h/o recent hospitalization [facility] for confusion and generalized weakness was discharged to SNF with tegretol for possible focal seizures after 2 weeks stay at SNF, he became more somnolent, decreased po intake,. [facility] was [facility], underwent MRI of the brain , did not show any acute stroke, showed old strokes. He was found to be in acute renal failure and hyeprnatremic. He was treated for ARF and hypernatremia with fluids. But he remained confused and encephalopathic.  CXR revealed Suggestion of interstitial edema has resolved. There is some atelectasis at the left lung base. Heart size is stable and t the upper limits of normal. No significant pleural fluid is identified.   Question of  dysphagia/ no po intake over the last 2 to 3 decreased.  Family is discussing about feeding tube placement.  Per records faxed from SNF, pt. was consuming nectar thick liquids, diet texture unknown to this therapist.      Assessment / Plan / Recommendation Clinical Impression  Dysphagia Diagnosis: Moderate oral phase dysphagia;Moderate pharyngeal phase dysphagia Clinical impression: Objective swallow study completed to identify  impairments in swallow function, pt presents with moderate oropharyngeal dysphagia demonstrated by oral weakness and pharyngeal sensory deficits. Oral phase impairments are characterized by lingual pumping and poor bolus propulsion with all solids and prolonged mastication with mech soft solid trial resulting in a delay in oral transit time. Pharygneal deficits are characterized by delay in swallow initiation to the valleculae with solids and pyriform sinuses with all liquids and mild pharyngeal residuals. During cup sips of thin liquids, pt impulsive and not responsive to cues for small sips, pt observed with flash penetration during swallow 3x with throat clear 1x and silent aspiration 1x secondary to sensory deficits and delay in swallow initation. Pt not responsive to verbal and visual cues to throat clear, cough, or swallow to reduce residuals or clear aspirates. Pt judged safe to consume conservative diet of dys 3 (mech soft solids) and nectar thick liquids at this time with medication crushed in puree. SLP to f/u to assess tolerance of upgraded diet.    Treatment Recommendation  Therapy as outlined in treatment plan below    Diet Recommendation Dysphagia 3 (Mechanical Soft);Nectar-thick liquid   Liquid Administration via: No straw Medication Administration: Crushed with puree Supervision: Full supervision/cueing for compensatory strategies Compensations: Slow rate;Small sips/bites;Follow solids with liquid Postural Changes and/or Swallow Maneuvers: Seated upright 90 degrees;Upright 30-60 min after meal    Other  Recommendations Oral Care Recommendations: Oral care before and after PO Other Recommendations: Order thickener from pharmacy;Prohibited food (jello, ice cream, thin soups);Remove water pitcher   Follow Up Recommendations  Skilled Nursing facility    Frequency and Duration  min 2x/week  2 weeks   Pertinent Vitals/Pain C/o of pain on "tailbone"     SLP Swallow Goals Patient will  consume recommended diet without observed clinical signs of aspiration with: Minimal assistance Swallow Study Goal #1 - Progress: Progressing toward goal Patient will utilize recommended strategies during swallow to increase swallowing safety with: Moderate cueing Swallow Study Goal #2 - Progress: Progressing toward goal   General HPI: 79 y.o. male with h/o recent hospitalization [facility] for confusion and generalized weakness was discharged to SNF with tegretol for possible focal seizures after 2 weeks stay at SNF, he became more somnolent, decreased po intake,. [facility] was [facility], underwent MRI of the brain , did not show any acute stroke, showed old strokes. He was found to be in acute renal failure and hyeprnatremic. He was treated for ARF and hypernatremia with fluids. But he remained confused and encephalopathic.  CXR revealed Suggestion of interstitial edema has resolved. There is some atelectasis at the left lung base. Heart size is stable and t the upper limits of normal. No significant pleural fluid is identified.   Question of  dysphagia/ no po intake over the last 2 to 3 decreased.  Family is discussing about feeding tube placement.  Per records faxed from SNF, pt. was consuming nectar thick liquids, diet texture unknown to this therapist. Pt currently tolerating Dys 1 (puree) and nectar thick liquids in acute setting, MBS to objectively evaluate potential for advancement in diet.  Type of Study: Modified Barium Swallowing Study Reason for Referral: Objectively evaluate swallowing function Previous Swallow Assessment: BSE on 7/30 recommending NPO, upgraded at bedside to dys 1 (puree)/nectar Diet Prior to this Study: Dysphagia 1 (puree);Nectar-thick liquids Temperature Spikes Noted: No Respiratory Status: Supplemental O2 delivered via (comment) (nasal cannula) History of Recent Intubation: No Behavior/Cognition: Alert;Cooperative;Pleasant  mood;Impulsive;Doesn't follow directions Oral Cavity - Dentition: Poor condition;Missing dentition Self-Feeding Abilities: Able to feed self;Needs assist Patient Positioning: Upright in bed Baseline Vocal Quality: Clear    Reason for Referral Objectively evaluate swallowing function   Oral Phase Oral Preparation/Oral Phase Oral Phase: Impaired Oral - Solids Oral - Puree: Lingual pumping;Delayed oral transit Oral - Mechanical Soft: Impaired mastication;Lingual pumping;Reduced posterior propulsion;Delayed oral transit Oral - Pill: Pocketing in anterior sulcus;Reduced posterior propulsion;Other (Comment);Delayed oral transit (Mastication of pill )   Pharyngeal Phase Pharyngeal Phase Pharyngeal Phase: Impaired Pharyngeal - Nectar Pharyngeal - Nectar Cup: Delayed swallow initiation;Premature spillage to pyriform sinuses;Reduced tongue base retraction;Pharyngeal residue - valleculae Pharyngeal - Thin Pharyngeal - Thin Cup: Delayed swallow initiation;Premature spillage to pyriform sinuses;Reduced airway/laryngeal closure;Reduced tongue base retraction;Penetration/Aspiration during swallow;Trace aspiration;Pharyngeal residue - valleculae Penetration/Aspiration details (thin cup): Material enters airway, passes BELOW cords without attempt by patient to eject out (silent aspiration);Material enters airway, remains ABOVE vocal cords then ejected out Pharyngeal - Solids Pharyngeal - Puree: Delayed swallow initiation;Premature spillage to valleculae;Reduced tongue base retraction;Pharyngeal residue - valleculae;Pharyngeal residue - pyriform sinuses Pharyngeal - Mechanical Soft: Delayed swallow initiation;Premature spillage to valleculae;Reduced tongue base retraction;Pharyngeal residue - valleculae;Pharyngeal residue - pyriform sinuses  Cervical Esophageal Phase    GO    Cervical Esophageal Phase Cervical Esophageal Phase: Leonarda Salon         Lyanne Co MA CCC-SLP 10/14/2012, 10:51 AM

## 2012-10-14 NOTE — Progress Notes (Signed)
NEURO HOSPITALIST PROGRESS NOTE   SUBJECTIVE:                                                                                                                        More alert and awake, answers some questions.  Autoimmune profile and CSF 14-3-3 pending. Had swallowing evaluation earlier today.  OBJECTIVE:                                                                                                                           Vital signs in last 24 hours: Temp:  [97.5 F (36.4 C)-97.8 F (36.6 C)] 97.8 F (36.6 C) (08/14 0946) Pulse Rate:  [59-109] 109 (08/14 0946) Resp:  [18] 18 (08/14 0946) BP: (143-168)/(57-102) 168/102 mmHg (08/14 0946) SpO2:  [94 %-100 %] 97 % (08/14 0946) Weight:  [78.472 kg (173 lb)] 78.472 kg (173 lb) (08/13 2024)  Intake/Output from previous day: 08/13 0701 - 08/14 0700 In: 2414.5 [P.O.:716; I.V.:1640.5; IV Piggyback:58] Out: 3500 [Urine:3500] Intake/Output this shift:   Nutritional status: Dysphagia  Past Medical History  Diagnosis Date  . Aortic stenosis   . Diabetes mellitus type II   . Hypertension   . Gout   . GERD (gastroesophageal reflux disease)     Neurologic Exam:  Mental status: alert and awake, follows simple commands. No dysphasia or dysarthria. CN: Pupils equal round and reactive to light, blinks to threat bilaterally  Motor: seems to move all limbs symmetrically DTR: 1+ all over. Coordination and gait: no tested.   Lab Results: No results found for this basename: cbc, bmp, coags, chol, tri, ldl, hga1c   Lipid Panel No results found for this basename: CHOL, TRIG, HDL, CHOLHDL, VLDL, LDLCALC,  in the last 72 hours  Studies/Results: Dg Swallowing Func-speech Pathology  10/14/2012   Lyanne Co     10/14/2012 11:23 AM Objective Swallowing Evaluation: Modified Barium Swallowing Study   Patient Details  Name: Shaun Whitney MRN: [id number] Date of Birth: 1930/12/16  Today's Date: 10/14/2012  Time: 7829-5621 SLP Time Calculation (min): 25 min  Past Medical History:  Past Medical History  Diagnosis Date  . Aortic stenosis   . Diabetes mellitus type II   . Hypertension   . Gout   .  GERD (gastroesophageal reflux disease)    Past Surgical History:  Past Surgical History  Procedure Laterality Date  . Joint replacement      right knee  . Tee without cardioversion  01/15/2012    Procedure: TRANSESOPHAGEAL ECHOCARDIOGRAM (TEE);  Surgeon:  Laurey Morale, MD;  Location: [facility] ENDOSCOPY;  Service:  Cardiovascular;  Laterality: N/A;   HPI:  79 y.o. male with h/o recent hospitalization to [facility] on 7/10  for confusion and generalized weakness was discharged to SNF with  tegretol for possible focal seizures after 2 weeks stay at SNF,  he became more somnolent, decreased po intake,. He was [facility]  [facility], underwent MRI of the brain ,  did not show any acute stroke, showed old strokes. He was found  to be in acute renal failure and hyeprnatremic. He was treated  for ARF and hypernatremia with fluids. But he remained confused  and encephalopathic.  CXR revealed Suggestion of interstitial  edema has resolved. There is some atelectasis at the left lung  base. Heart size is stable and t the upper limits of normal. No  significant pleural fluid is identified.   Question of   dysphagia/ no po intake over the last 2 to 3 decreased.  Family  is discussing about feeding tube placement.  Per records faxed  from SNF, pt. was consuming nectar thick liquids, diet texture  unknown to this therapist.      Assessment / Plan / Recommendation Clinical Impression  Dysphagia Diagnosis: Moderate oral phase dysphagia;Moderate  pharyngeal phase dysphagia Clinical impression: Objective swallow study completed to  identify impairments in swallow function, pt presents with  moderate oropharyngeal dysphagia demonstrated by oral weakness  and pharyngeal sensory deficits. Oral phase impairments are  characterized by  lingual pumping and poor bolus propulsion with  all solids and prolonged mastication with mech soft solid trial  resulting in a delay in oral transit time. Pharygneal deficits  are characterized by delay in swallow initiation to the  valleculae with solids and pyriform sinuses with all liquids and  mild pharyngeal residuals. During cup sips of thin liquids, pt  impulsive and not responsive to cues for small sips, pt observed  with flash penetration during swallow 3x with throat clear 1x and  silent aspiration 1x secondary to sensory deficits and delay in  swallow initation. Pt not responsive to verbal and visual cues to  throat clear, cough, or swallow to reduce residuals or clear  aspirates. Pt judged safe to consume conservative diet of dys 3  (mech soft solids) and nectar thick liquids at this time with  medication crushed in puree. SLP to f/u to assess tolerance of  upgraded diet.    Treatment Recommendation  Therapy as outlined in treatment plan below    Diet Recommendation Dysphagia 3 (Mechanical Soft);Nectar-thick  liquid   Liquid Administration via: No straw Medication Administration: Crushed with puree Supervision: Full supervision/cueing for compensatory strategies Compensations: Slow rate;Small sips/bites;Follow solids with  liquid Postural Changes and/or Swallow Maneuvers: Seated upright 90  degrees;Upright 30-60 min after meal    Other  Recommendations Oral Care Recommendations: Oral care  before and after PO Other Recommendations: Order thickener from pharmacy;Prohibited  food (jello, ice cream, thin soups);Remove water pitcher   Follow Up Recommendations  Skilled Nursing facility    Frequency and Duration min 2x/week  2 weeks   Pertinent Vitals/Pain C/o of pain on "tailbone"     SLP Swallow Goals Patient will consume recommended  diet without observed clinical  signs of aspiration with: Minimal assistance Swallow Study Goal #1 - Progress: Progressing toward goal Patient will utilize recommended  strategies during swallow to  increase swallowing safety with: Moderate cueing Swallow Study Goal #2 - Progress: Progressing toward goal   General HPI: 79 y.o. male with h/o recent hospitalization to  [facility] on [facility]10 for confusion and generalized weakness was  discharged to SNF with tegretol for possible focal seizures after  2 weeks stay at SNF, he became more somnolent, decreased po  intake,. He was [facility] back [facility],  underwent MRI of the brain , did not show any acute stroke,  showed old strokes. He was found to be in acute renal failure and  hyeprnatremic. He was treated for ARF and hypernatremia with  fluids. But he remained confused and encephalopathic.  CXR  revealed Suggestion of interstitial edema has resolved. There is  some atelectasis at the left lung base. Heart size is stable and  t the upper limits of normal. No significant pleural fluid is  identified.   Question of  dysphagia/ no po intake over the last  2 to 3 decreased.  Family is discussing about feeding tube  placement.  Per records faxed from SNF, pt. was consuming nectar  thick liquids, diet texture unknown to this therapist. Pt  currently tolerating Dys 1 (puree) and nectar thick liquids in  acute setting, MBS to objectively evaluate potential for  advancement in diet.  Type of Study: Modified Barium Swallowing Study Reason for Referral: Objectively evaluate swallowing function Previous Swallow Assessment: BSE on 7/30 recommending NPO,  upgraded at bedside to dys 1 (puree)/nectar Diet Prior to this Study: Dysphagia 1 (puree);Nectar-thick  liquids Temperature Spikes Noted: No Respiratory Status: Supplemental O2 delivered via (comment)  (nasal cannula) History of Recent Intubation: No Behavior/Cognition: Alert;Cooperative;Pleasant  mood;Impulsive;Doesn't follow directions Oral Cavity - Dentition: Poor condition;Missing dentition Self-Feeding Abilities: Able to feed self;Needs assist Patient Positioning: Upright in  bed Baseline Vocal Quality: Clear    Reason for Referral Objectively evaluate swallowing function   Oral Phase Oral Preparation/Oral Phase Oral Phase: Impaired Oral - Solids Oral - Puree: Lingual pumping;Delayed oral transit Oral - Mechanical Soft: Impaired mastication;Lingual  pumping;Reduced posterior propulsion;Delayed oral transit Oral - Pill: Pocketing in anterior sulcus;Reduced posterior  propulsion;Other (Comment);Delayed oral transit (Mastication of  pill )   Pharyngeal Phase Pharyngeal Phase Pharyngeal Phase: Impaired Pharyngeal - Nectar Pharyngeal - Nectar Cup: Delayed swallow initiation;Premature  spillage to pyriform sinuses;Reduced tongue base  retraction;Pharyngeal residue - valleculae Pharyngeal - Thin Pharyngeal - Thin Cup: Delayed swallow initiation;Premature  spillage to pyriform sinuses;Reduced airway/laryngeal  closure;Reduced tongue base retraction;Penetration/Aspiration  during swallow;Trace aspiration;Pharyngeal residue - valleculae Penetration/Aspiration details (thin cup): Material enters  airway, passes BELOW cords without attempt by patient to eject  out (silent aspiration);Material enters airway, remains ABOVE  vocal cords then ejected out Pharyngeal - Solids Pharyngeal - Puree: Delayed swallow initiation;Premature spillage  to valleculae;Reduced tongue base retraction;Pharyngeal residue -  valleculae;Pharyngeal residue - pyriform sinuses Pharyngeal - Mechanical Soft: Delayed swallow  initiation;Premature spillage to valleculae;Reduced tongue base  retraction;Pharyngeal residue - valleculae;Pharyngeal residue -  pyriform sinuses  Cervical Esophageal Phase    GO    Cervical Esophageal Phase Cervical Esophageal Phase: Leonarda Salon         Lyanne Co MA CCC-SLP 10/14/2012, 10:51 AM     MEDICATIONS  I have reviewed the patient's current medications.  ASSESSMENT/PLAN:                                                                                                             Mr. Molinelli seems to be doing better, although obviously has significant cognitive impairment. I reviewed patient CT brain 10/06/12 and MRI brain 10/07/12. Although different techniques, the findings are not significant enough to make me concern about the possibility of NPH, as the ventricular size is not out of proportion to the degree of global cortical atrophy. Therefore, will not recommend further neuro-imaging at this moment. We will continue to follow.  Wyatt Portela, MD [facility] [phone]  10/14/2012, 11:56 AM

## 2012-10-14 NOTE — Progress Notes (Signed)
[facility] PROGRESS NOTE  Shaun Whitney ZOX:[id number] DOB: 20-Apr-1930 DOA: 09/28/2012 PCP: Kirstie Peri, MD  Assessment/Plan: 1. Acute encephalitis; per neurology; autoimmune encephalitis panel including voltage-gated potassium channel Pending;  autoimmune panel as well as protein 14 33 from CSF Pending, continue Solumedrol 1gm qday x 5 days. Pt probably at the best level he will obtain. Will consider D/C in Am   2. 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 Dr Wyatt Portela (neurologist) address question and feels that NPH is not the cause of patient's encephalitis   3. HTN; well controlled   4. CHF; stable, patient not complaining of any chest pains, patient is negative total I/O. we'll obtain daily a.m. Weights current weight= 173lbs, down from 184lbs   5. LLE DVT; continue Xarelto  6. ARF; resolved patient's creatinine= 0.87  7. moderate old/pharyngeal dysphagia; Ensure diet is Dysphagia 3 (Mechanical Soft);Nectar-thick liquid   8.DM; patient's CBGs are running high increase SSI to moderate     Code Status: Full Family Communication: Spoke with son counseled him on plan of care Disposition Plan: Per neurology   Consultants:  Neurology  Procedures:  CT head without contrast 10/06/2012 Motion degraded exam.  No intracranial hemorrhage.  Small vessel disease type changes without CT findings of large  acute infarct.  Global atrophy. Ventricular prominence may be related to atrophy  although difficult to exclude a mild component hydrocephalus.     Antibiotics:    HPI/Subjective: 79 yo WM PMHx congestive heart failure, acute encephalopathy, CK-MB stage III, DM, hypernatremia, HTN, DVT, was recently hospitalized at [facility] on 09/09/2012 for confusion and generalized weakness.During that admission,  it was felt the patient may have been having focal seizures and he was started on Tegretol . He was discharged to a SNF. Two weeks later patient developed increasing lethargy and poor oral intake. He was readmitted to [facility] on 09/26/2012. 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 He was found to be in acute renal failure with hypernatremia and was hydrated. He remained confused and encephalopathic. Family spoke with the patient's primary care physician and requested the patient be transferred to [facility]. Neurology consulted and are following patient. Patient has had extensive workup including MRI brain, EEG and LP. Etiology not revealing thus far. Based on neurology recommendations, started pulse IV steroids on 10/11/12 for 5 days for possible autoimmune encephalitis and neurology recommends continued inpatient care. TODAY patient alert and oriented x 1, follows all commands     Objective: Filed Vitals:   10/13/12 0946 10/13/12 1721 10/13/12 2024 10/14/12 0424  BP: 155/61 143/74 157/71 161/57  Pulse: 65 62 59 61  Temp: 98.2 F (36.8 C) 97.8 F (36.6 C) 97.5 F (36.4 C) 97.5 F (36.4 C)  TempSrc: Oral  Oral Oral  Resp: 18 18 18 18   Height:   5\' 9"  (1.753 m)   Weight:   78.472 kg (173 lb)   SpO2: 98% 94% 98% 100%    Intake/Output Summary (Last 24 hours) at 10/14/12 0858 Last data filed at 10/14/12 0446  Gross per 24 hour  Intake 2414.5 ml  Output   3050 ml  Net -635.5 ml   Filed Weights   10/10/12 2008 10/11/12 2200 10/13/12 2024  Weight: 83.87 kg (184 lb 14.4 oz) 83.9 kg (184 lb 15.5 oz) 78.472  kg (173 lb)    Exam:   General:  A/O x 1, NAD  Cardiovascular: Regular rhythm and rate, systolic murmur, DP/PT +1 bilateral  Respiratory: Good auscultation bilateral  Abdomen: Soft nontender nondistended plus bowel sounds  Musculoskeletal: Bilateral pedal edema   Data Reviewed: Basic Metabolic Panel:  Recent Labs Lab 10/07/12 1456 10/08/12 0500  10/13/12 0400  NA 142 142  --   K 3.9 3.9  --   CL 107 106  --   CO2 22 24  --   GLUCOSE 147* 153*  --   BUN 19 20  --   CREATININE 1.21 1.18 0.87  CALCIUM 8.8 8.9  --    Liver Function Tests: No results found for this basename: AST, ALT, ALKPHOS, BILITOT, PROT, ALBUMIN,  in the last 168 hours No results found for this basename: LIPASE, AMYLASE,  in the last 168 hours No results found for this basename: AMMONIA,  in the last 168 hours CBC:  Recent Labs Lab 10/10/12 0500 10/13/12 0400  WBC 9.7 7.9  HGB 9.4* 9.8*  HCT 27.4* 28.4*  MCV 97.2 95.3  PLT 427* 495*   Cardiac Enzymes: No results found for this basename: CKTOTAL, CKMB, CKMBINDEX, TROPONINI,  in the last 168 hours BNP (last 3 results) No results found for this basename: PROBNP,  in the last 8760 hours CBG:  Recent Labs Lab 10/13/12 1141 10/13/12 1630 10/13/12 1841 10/13/12 2230 10/14/12 0748  GLUCAP 278* 203* 266* 259* 178*    Recent Results (from the past 240 hour(s))  CULTURE, BLOOD (ROUTINE X 2)     Status: None   Collection Time    10/04/12  5:50 PM      Result Value Range Status   Specimen Description BLOOD RIGHT ARM   Final   Special Requests BOTTLES DRAWN AEROBIC AND ANAEROBIC 10CC   Final   Culture  Setup Time     Final   Value: 10/04/2012 22:52     Performed at [facility]   Culture     Final   Value: NO GROWTH 5 DAYS     Performed at [facility]   Report Status 10/10/2012 FINAL   Final  CULTURE, BLOOD (ROUTINE X 2)     Status: None   Collection Time    10/04/12  5:57 PM      Result Value Range Status   Specimen Description BLOOD RIGHT ARM   Final   Special Requests BOTTLES DRAWN AEROBIC AND ANAEROBIC 10CC   Final   Culture  Setup Time     Final   Value: 10/04/2012 22:53     Performed at [facility]   Culture     Final   Value: NO GROWTH 5 DAYS     Performed at [facility]   Report Status 10/10/2012 FINAL   Final  URINE CULTURE     Status: None    Collection Time    10/04/12  6:35 PM      Result Value Range Status   Specimen Description URINE, CLEAN CATCH   Final   Special Requests NONE   Final   Culture  Setup Time     Final   Value: 10/04/2012 20:43     Performed at [facility] Count     Final   Value: NO GROWTH     Performed at [facility]   Culture     Final   Value: NO GROWTH  Performed at [facility]   Report Status 10/06/2012 FINAL   Final  CSF CULTURE     Status: None   Collection Time    10/08/12  2:36 PM      Result Value Range Status   Specimen Description CSF   Final   Special Requests 5.5ML CSF FLUID   Final   Gram Stain     Final   Value: RARE WBC PRESENT, PREDOMINANTLY MONONUCLEAR     NO ORGANISMS SEEN     Performed at [facility]     Performed at [facility]   Culture     Final   Value: NO GROWTH 3 DAYS     Performed at [facility]   Report Status 10/12/2012 FINAL   Final  GRAM STAIN     Status: None   Collection Time    10/08/12  2:36 PM      Result Value Range Status   Specimen Description CSF   Final   Special Requests 5.5ML CSF FLUID   Final   Gram Stain     Final   Value: CYTOSPIN PREP     WBC PRESENT, PREDOMINANTLY MONONUCLEAR     NO ORGANISMS SEEN   Report Status 10/08/2012 FINAL   Final  FUNGUS CULTURE W SMEAR     Status: None   Collection Time    10/08/12  2:36 PM      Result Value Range Status   Specimen Description CSF   Final   Special Requests 5.5ML FLUID   Final   Fungal Smear     Final   Value: NO YEAST OR FUNGAL ELEMENTS SEEN     Performed at [facility]   Culture     Final   Value: CULTURE IN PROGRESS FOR FOUR WEEKS     Performed at [facility]   Report Status PENDING   Incomplete     Studies: No results found.  Scheduled Meds: . carvedilol  12.5 mg Oral BID  . feeding supplement  1 Container Oral TID BM  . insulin aspart  0-20 Units Subcutaneous TID WC  . insulin aspart  0-5 Units  Subcutaneous QHS  . insulin aspart  6 Units Subcutaneous TID WC  . insulin detemir  25 Units Subcutaneous QHS  . methylPREDNISolone (SOLU-MEDROL) injection  1,000 mg Intravenous Daily  . rivaroxaban  15 mg Oral BID WC  . [START ON 10/22/2012] rivaroxaban  20 mg Oral Q supper  . senna  2 tablet Oral Daily  . sodium chloride  3 mL Intravenous Q12H   Continuous Infusions: . 0.9 % NaCl with KCl 20 mEq / L 75 mL/hr at 10/14/12 0255    Active Problems:   HTN (hypertension)   Chronic combined systolic and diastolic congestive heart failure   CAD (coronary artery disease)   Acute encephalopathy   Hypokalemia   Dehydration with hypernatremia   Fever   CKD (chronic kidney disease) stage 3, GFR 30-59 ml/min   Acute renal failure   Thrombocytopenia, unspecified   Anemia   UTI (urinary tract infection)   DVT (deep venous thrombosis)   Fever, unspecified   Left wrist pain- ? Gout. also Wrist drop.   Diabetes mellitus   NPH (normal pressure hydrocephalus)    Time spent: 50 minutes   Drema Dallas  [facility] Pager [phone] If 7PM-7AM, please contact night-coverage at [email or web address], password [email or web address] 10/14/2012, 8:58 AM  LOS: 16 days

## 2012-10-14 NOTE — Progress Notes (Signed)
SNF bed confirmed at [facility] for tomorrow or Saturday- pending medical clearance. Family to do paperwork at SNF tomorrow am- CSW will f/u in the morning for further d/c arrangements.  Reece Levy, MSW [phone]

## 2012-10-15 DIAGNOSIS — I82409 Acute embolism and thrombosis of unspecified deep veins of unspecified lower extremity: Secondary | ICD-10-CM | POA: Diagnosis present

## 2012-10-15 DIAGNOSIS — R1310 Dysphagia, unspecified: Secondary | ICD-10-CM | POA: Diagnosis present

## 2012-10-15 LAB — GLUCOSE, CAPILLARY
Glucose-Capillary: 171 mg/dL — ABNORMAL HIGH (ref 70–99)
Glucose-Capillary: 172 mg/dL — ABNORMAL HIGH (ref 70–99)

## 2012-10-15 LAB — OLIGOCLONAL BANDS, CSF + SERM

## 2012-10-15 MED ORDER — INSULIN ASPART 100 UNIT/ML ~~LOC~~ SOLN: 6.0000 [IU] | Freq: Three times a day (TID) | SUBCUTANEOUS | Status: AC

## 2012-10-15 MED ORDER — PREDNISONE 5 MG PO TABS: 5.0000 mg | ORAL_TABLET | Freq: Every day | ORAL | Status: AC

## 2012-10-15 MED ORDER — RIVAROXABAN 15 MG PO TABS: 15.0000 mg | ORAL_TABLET | Freq: Two times a day (BID) | ORAL | Status: DC

## 2012-10-15 MED ORDER — PREDNISONE 5 MG PO TABS
5.0000 mg | ORAL_TABLET | Freq: Every day | ORAL | Status: DC
Start: 2012-10-15 — End: 2012-10-15
  Administered 2012-10-15: 5 mg via ORAL
  Filled 2012-10-15 (×2): qty 1

## 2012-10-15 MED ORDER — RIVAROXABAN 20 MG PO TABS: 20.0000 mg | ORAL_TABLET | Freq: Every day | ORAL | Status: AC

## 2012-10-15 MED ORDER — INSULIN DETEMIR 100 UNIT/ML ~~LOC~~ SOLN: 25.0000 [IU] | Freq: Every day | SUBCUTANEOUS | Status: AC

## 2012-10-15 NOTE — Progress Notes (Signed)
PIV removed, foley d/c'd. [facility] arrived for transportation.

## 2012-10-15 NOTE — Discharge Summary (Signed)
Physician Discharge Summary  Shaun Whitney ZOX:[id number] DOB: 1930-08-30 DOA: 09/28/2012  PCP: Kirstie Peri, MD  Admit date: 09/28/2012 Discharge date: 10/15/2012  Time spent: 30 minutes  Recommendations for Outpatient Follow-up:  1. Acute encephalitis; per neurology; autoimmune encephalitis panel including voltage-gated potassium channel Pending; autoimmune panel as well as protein 14 33 from CSF Pending, continue Solumedrol 1gm qday x 5 days. Spoke with Dr Wyatt Portela (neurologist)  this a.m. concerning discharging patient. Agree with discharge patient on low-dose steroids (prednisone 5 mg daily) and patient will follow up as an outpatient to neurology.  2. 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 Dr Wyatt Portela (neurologist) address question and feels that NPH is not the cause of patient's encephalitis  3. HTN; not well-controlled most likely secondary to removal of his lisinopril secondary to acute renal failure. We'll restart his home dose of lisinopril 40 mg daily   4. CHF; stable, patient not complaining of any chest pains, patient is negative total I/O. we'll obtain daily a.m. Weights current weight= 173lbs, down from 184lbs   5. LLE DVT; continue Xarelto  6. ARF; resolved patient's creatinine= 0.87   7. moderate old/pharyngeal dysphagia; Ensure diet is Dysphagia 3 (Mechanical Soft);Nectar-thick liquid   8.DM; place patient back on home regimen level may or 25 units daily, NovoLog 6 units q. a.c.   Discharge Diagnoses:  Active Problems:   HTN (hypertension)   Chronic combined systolic and diastolic congestive heart failure   CAD (coronary artery disease)   Acute encephalopathy   Hypokalemia   Dehydration with hypernatremia   Fever   CKD (chronic kidney disease) stage 3, GFR 30-59 ml/min   Acute renal failure    Thrombocytopenia, unspecified   Anemia   UTI (urinary tract infection)   Fever, unspecified   Left wrist pain- ? Gout. also Wrist drop.   Diabetes mellitus   DVT of leg (deep venous thrombosis)   Dysphagia, unspecified(787.20)   Discharge Condition: Stable  Diet recommendation: diet is Dysphagia 3 (Mechanical Soft);Nectar-thick liquid    Filed Weights   10/11/12 2200 10/13/12 2024 10/14/12 2000  Weight: 83.9 kg (184 lb 15.5 oz) 78.472 kg (173 lb) 77.701 kg (171 lb 4.8 oz)    History of present illness:  79 yo WM PMHx congestive heart failure, acute encephalopathy, CK-MB stage III, DM, hypernatremia, HTN, DVT, was recently hospitalized at [facility] on 09/09/2012 for confusion and generalized weakness.During that admission, it was felt the patient may have been having focal seizures and he was started on Tegretol . He was discharged to a SNF. Two weeks later patient developed increasing lethargy and poor oral intake. He was readmitted to [facility] on 09/26/2012. 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 He was found to be in acute renal failure with hypernatremia and was hydrated. He remained confused and encephalopathic. Family spoke with the patient's primary care physician and requested the patient be transferred to [facility] Neurology consulted and are following patient. Patient has had extensive workup including MRI brain, EEG and LP. Etiology not revealing thus far. Based on neurology recommendations, started pulse IV steroids on 10/11/12 for 5 days for possible autoimmune encephalitis and neurology recommends continued inpatient care. TODAY patient alert and oriented x 1, follows all commands. Neurology agrees with discharge and followup in 2 weeks as an  outpatient   Hospital Course:   Procedures:    Consultations:  Neurology  Discharge Exam: Filed Vitals:   10/14/12 1340 10/14/12 1856 10/14/12 2000 10/15/12 0446  BP: 129/90 158/78 136/76  162/68  Pulse: 66 74 72 64  Temp: 97.4 F (36.3 C) 97.8 F (36.6 C) 98 F (36.7 C) 98.1 F (36.7 C)  TempSrc: Oral  Oral Oral  Resp: 18 18 18 18   Height:   5\' 9"  (1.753 m)   Weight:   77.701 kg (171 lb 4.8 oz)   SpO2: 97% 98% 99% 99%   General: A/O x 1, NAD  Cardiovascular: Regular rhythm and rate, systolic murmur, DP/PT +1 bilateral  Respiratory: Good auscultation bilateral  Abdomen: Soft nontender nondistended plus bowel sounds  Musculoskeletal: Bilateral pedal edema Lt > Rt   Discharge Instructions     Medication List    STOP taking these medications       carbamazepine 200 MG tablet  Commonly known as:  TEGRETOL     furosemide 40 MG tablet  Commonly known as:  LASIX      TAKE these medications       allopurinol 300 MG tablet  Commonly known as:  ZYLOPRIM  Take 300 mg by mouth daily.     aspirin EC 81 MG tablet  Take 81 mg by mouth daily.     bisacodyl 10 MG suppository  Commonly known as:  DULCOLAX  Place 10 mg rectally daily as needed for constipation.     carvedilol 12.5 MG tablet  Commonly known as:  COREG  Take 12.5 mg by mouth 2 (two) times daily.     insulin aspart 100 UNIT/ML injection  Commonly known as:  novoLOG  Inject 6 Units into the skin 3 (three) times daily with meals.     insulin detemir 100 UNIT/ML injection  Commonly known as:  LEVEMIR  Inject 0.25 mL (25 Units total) into the skin at bedtime.     lisinopril 40 MG tablet  Commonly known as:  PRINIVIL,ZESTRIL  Take 1 tablet (40 mg total) by mouth daily.     magnesium hydroxide 400 MG/5ML suspension  Commonly known as:  MILK OF MAGNESIA  Take 30 mLs by mouth daily as needed for constipation.     metFORMIN 500 MG tablet  Commonly known as:  GLUCOPHAGE  Take 500 mg by mouth 2 (two) times daily with a meal.     omeprazole 20 MG capsule  Commonly known as:  PRILOSEC  Take 1 capsule by mouth 2 (two) times daily.     predniSONE 5 MG tablet  Commonly known as:  DELTASONE  Take  1 tablet (5 mg total) by mouth daily with breakfast.  Start taking on:  10/16/2012     promethazine 25 MG suppository  Commonly known as:  PHENERGAN  Place 25 mg rectally every 4 (four) hours as needed for nausea.     Rivaroxaban 15 MG Tabs tablet  Commonly known as:  XARELTO  Take 1 tablet (15 mg total) by mouth 2 (two) times daily with a meal. Stop date 10/22/2012; then we'll start some Xarelto 20 mg daily     Rivaroxaban 20 MG Tabs tablet  Commonly known as:  XARELTO  Take 1 tablet (20 mg total) by mouth daily with supper.  Start taking on:  10/22/2012       No Known Allergies Follow-up Information   Schedule an appointment as soon as possible for a visit with [facility], MD.  Specialty:  Internal Medicine   Contact information:   [address]  [place] [address] [phone]       Follow up with Wyatt Portela, MD. Schedule an appointment as soon as possible for a visit in 14 days.   Specialty:  Neurology   Contact information:   [address] [place] [address] [phone]        The results of significant diagnostics from this hospitalization (including imaging, microbiology, ancillary and laboratory) are listed below for reference.    Significant Diagnostic Studies: Dg Chest 2 View  10/05/2012   *RADIOLOGY REPORT*  Clinical Data: Fever, shortness of breath, question pneumonia, history hypertension, diabetes, coronary artery disease, CHF  CHEST - 2 VIEW  Comparison: 09/28/2012  Findings: Enlargement of cardiac silhouette. Atherogenic calcification aorta. Mediastinal contours and pulmonary vascularity normal. Decreased lung volumes with minimal bibasilar atelectasis. No definite acute infiltrate, pleural effusion or pneumothorax. Bones demineralized.  IMPRESSION: Enlargement of cardiac silhouette. No definite acute abnormalities.   Original Report Authenticated By: Ulyses Southward, M.D.   Dg Wrist Complete Left  10/10/2012   *RADIOLOGY REPORT*  Clinical Data:  Left wrist pain  LEFT WRIST - COMPLETE 3+ VIEW  Comparison: None  Findings: There is no evidence of acute fracture, subluxation or dislocation. Mild degenerative changes of the radiocarpal and first carpometacarpal joints noted Vascular calcifications are present. No focal bony lesions are identified.  IMPRESSION: No acute bony abnormalities.  Mild degenerative changes.   Original Report Authenticated By: Harmon Pier, M.D.   Ct Head Wo Contrast  10/06/2012   *RADIOLOGY REPORT*  Clinical Data: Altered mental status.  Left-sided weakness. Diabetic hypertensive patient.  CT HEAD WITHOUT CONTRAST  Technique:  Contiguous axial images were obtained from the base of the skull through the vertex without contrast.  Comparison: 09/27/2012 brain MR. 09/26/2012 and 05/22/2008 head CT.  Findings: Motion degraded exam.  No intracranial hemorrhage.  Small vessel disease type changes without CT findings of large acute infarct.  Global atrophy.  Ventricular prominence may be related to atrophy although difficult to exclude a mild component hydrocephalus.  No intracranial mass lesion detected on this unenhanced exam.  Mastoid air cells, middle ear cavities and visualized paranasal sinuses are clear.  Orbital structures unremarkable.  IMPRESSION: Motion degraded exam.  No intracranial hemorrhage.  Small vessel disease type changes without CT findings of large acute infarct.  Global atrophy.  Ventricular prominence may be related to atrophy although difficult to exclude a mild component hydrocephalus.   Original Report Authenticated By: Lacy Duverney, M.D.   Mr Brain Wo Contrast  10/07/2012   *RADIOLOGY REPORT*  Clinical Data:  79 year old male with altered mental status.  Left hand swelling.  Left wrist drop.  MRI HEAD WITHOUT CONTRAST MRI CERVICAL SPINE WITHOUT CONTRAST  Technique:  Multiplanar, multiecho pulse sequences of the brain and surrounding structures, and cervical spine, to include the craniocervical junction and  cervicothoracic junction, were obtained without intravenous contrast.  Comparison:  [facility] brain MRI 09/27/2012 and earlier.  Head CT 10/06/2012.  MRI HEAD  Findings:  Stable cerebral volume. No restricted diffusion to suggest acute infarction.  No midline shift, mass effect, evidence of mass lesion, ventriculomegaly, extra-axial collection or acute intracranial hemorrhage.  Cervicomedullary junction and pituitary are within normal limits.  Major intracranial vascular flow voids are stable.  Mild T2 heterogeneity in the deep gray matter nuclei, not significantly changed.  Favor small vessel ischemia related. Mild for age cerebral white matter T2 and  FLAIR hyperintensity was better demonstrated on previous studies with left motion.  No acute signal abnormality identified in the brain.  Decreased bone marrow signal in the visualized cervical spine of unclear etiology and significance.  Grossly negative visualized cervical spinal cord.  Bone marrow heterogeneity also in the clivus which has increased from earlier this year.  No destructive osseous lesion identified.  Stable orbit soft tissues, paranasal sinuses, mastoids, and scalp soft tissues.  IMPRESSION: 1. No acute intracranial abnormality.  Stable noncontrast MRI appearance of the brain. 2.  Loss of normal bone marrow signal at the skull base and cervical spine, appears new or progressed since earlier this year. Query anemia or other benign etiology.  If the patient has a known malignancy metastatic disease to bone is not excluded. 3.  Additional cervical spine findings below.  MRI CERVICAL SPINE  Findings: Study is intermittently degraded by motion artifact despite repeated imaging attempts.  Mild reversal of cervical lordosis. No marrow edema or evidence of acute osseous abnormality.  There is mild increased signal in the C4-C5 disc space, but also some evidence of interbody ankylosis at this level.  Favor this signal changes degenerative.   There is fluid in both sternoclavicular joints (series 14 image 12).  No definite associated bone marrow edema in the adjacent manubrium or medial clavicles.  Visualized paraspinal soft tissues are within normal limits.  C2-C3:  Moderate facet hypertrophy.  Mild disc bulge.  Ligament flavum hypertrophy.  Borderline spinal stenosis without spinal cord mass effect.  Moderate to severe bilateral C3 foraminal stenosis.  C3-C4:  Severe facet hypertrophy on the left.  Moderate facet hypertrophy on the right.  Negative disc and no spinal stenosis. Severe left greater than right C4 foraminal stenosis.  C4-C5:  Severe facet hypertrophy on the right.  No spinal stenosis. Moderate right C5 foraminal stenosis.  C5-C6:  Bulky mostly anterior eccentric circumferential disc osteophyte complex.  Bulky uncovertebral hypertrophy.  Moderate facet hypertrophy.  No significant spinal stenosis.  Severe right and moderate to severe left C6 foraminal stenosis.  C6-C7:  Bulky mostly right and anterior eccentric circumferential disc osteophyte complex.  No spinal stenosis.  Mild left C7 foraminal stenosis.  C7-T1:  Moderate facet hypertrophy.  Mild uncovertebral hypertrophy greater on the left.  Moderate left C8 foraminal stenosis.  No upper thoracic spinal stenosis identified.  IMPRESSION: 1.  No acute osseous abnormality identified in the cervical spine. There is bilateral sternoclavicular joint fluid, favor degenerative without strong evidence of sternoclavicular osteomyelitis at this time. 2.  Advanced cervical spine degenerative changes, especially facet arthropathy.  There is borderline to mild cervical spinal stenosis at C2-C3, otherwise moderate to severe foraminal stenosis is present (severe at most cervical neural foramen). 3.  Bulky lower cervical endplate osteophytosis, but primarily anteriorly directed, without associated cervical spinal stenosis.   Original Report Authenticated By: Erskine Speed, M.D.   Mr Cervical Spine Wo  Contrast  10/07/2012   *RADIOLOGY REPORT*  Clinical Data:  79 year old male with altered mental status.  Left hand swelling.  Left wrist drop.  MRI HEAD WITHOUT CONTRAST MRI CERVICAL SPINE WITHOUT CONTRAST  Technique:  Multiplanar, multiecho pulse sequences of the brain and surrounding structures, and cervical spine, to include the craniocervical junction and cervicothoracic junction, were obtained without intravenous contrast.  Comparison:  [facility] brain MRI 09/27/2012 and earlier.  Head CT 10/06/2012.  MRI HEAD  Findings:  Stable cerebral volume. No restricted diffusion to suggest acute infarction.  No midline shift, mass effect,  evidence of mass lesion, ventriculomegaly, extra-axial collection or acute intracranial hemorrhage.  Cervicomedullary junction and pituitary are within normal limits.  Major intracranial vascular flow voids are stable.  Mild T2 heterogeneity in the deep gray matter nuclei, not significantly changed.  Favor small vessel ischemia related. Mild for age cerebral white matter T2 and FLAIR hyperintensity was better demonstrated on previous studies with left motion.  No acute signal abnormality identified in the brain.  Decreased bone marrow signal in the visualized cervical spine of unclear etiology and significance.  Grossly negative visualized cervical spinal cord.  Bone marrow heterogeneity also in the clivus which has increased from earlier this year.  No destructive osseous lesion identified.  Stable orbit soft tissues, paranasal sinuses, mastoids, and scalp soft tissues.  IMPRESSION: 1. No acute intracranial abnormality.  Stable noncontrast MRI appearance of the brain. 2.  Loss of normal bone marrow signal at the skull base and cervical spine, appears new or progressed since earlier this year. Query anemia or other benign etiology.  If the patient has a known malignancy metastatic disease to bone is not excluded. 3.  Additional cervical spine findings below.  MRI  CERVICAL SPINE  Findings: Study is intermittently degraded by motion artifact despite repeated imaging attempts.  Mild reversal of cervical lordosis. No marrow edema or evidence of acute osseous abnormality.  There is mild increased signal in the C4-C5 disc space, but also some evidence of interbody ankylosis at this level.  Favor this signal changes degenerative.  There is fluid in both sternoclavicular joints (series 14 image 12).  No definite associated bone marrow edema in the adjacent manubrium or medial clavicles.  Visualized paraspinal soft tissues are within normal limits.  C2-C3:  Moderate facet hypertrophy.  Mild disc bulge.  Ligament flavum hypertrophy.  Borderline spinal stenosis without spinal cord mass effect.  Moderate to severe bilateral C3 foraminal stenosis.  C3-C4:  Severe facet hypertrophy on the left.  Moderate facet hypertrophy on the right.  Negative disc and no spinal stenosis. Severe left greater than right C4 foraminal stenosis.  C4-C5:  Severe facet hypertrophy on the right.  No spinal stenosis. Moderate right C5 foraminal stenosis.  C5-C6:  Bulky mostly anterior eccentric circumferential disc osteophyte complex.  Bulky uncovertebral hypertrophy.  Moderate facet hypertrophy.  No significant spinal stenosis.  Severe right and moderate to severe left C6 foraminal stenosis.  C6-C7:  Bulky mostly right and anterior eccentric circumferential disc osteophyte complex.  No spinal stenosis.  Mild left C7 foraminal stenosis.  C7-T1:  Moderate facet hypertrophy.  Mild uncovertebral hypertrophy greater on the left.  Moderate left C8 foraminal stenosis.  No upper thoracic spinal stenosis identified.  IMPRESSION: 1.  No acute osseous abnormality identified in the cervical spine. There is bilateral sternoclavicular joint fluid, favor degenerative without strong evidence of sternoclavicular osteomyelitis at this time. 2.  Advanced cervical spine degenerative changes, especially facet arthropathy.  There  is borderline to mild cervical spinal stenosis at C2-C3, otherwise moderate to severe foraminal stenosis is present (severe at most cervical neural foramen). 3.  Bulky lower cervical endplate osteophytosis, but primarily anteriorly directed, without associated cervical spinal stenosis.   Original Report Authenticated By: Erskine Speed, M.D.   US Abdomen Complete  09/29/2012   *RADIOLOGY REPORT*  Clinical Data:  elevated bilirubin, abdominal tenderness  ABDOMINAL ULTRASOUND COMPLETE  Comparison:  09/27/12  Findings:  Gallbladder:  Similar to the prior study, there is gallbladder sludge and a few tiny calculi.  No Murphy's sign or wall  thickening.  Common Bile Duct:  Normal at 4 mm  Liver: No focal mass lesion identified.  Mild increased echogenicity consistent with fatty infiltration.  IVC:  Appears normal.  Pancreas:  Not seen well.  Spleen:  Within normal limits in size and echotexture.  Right kidney:  Normal in size and parenchymal echogenicity.  No evidence of mass or hydronephrosis.  Left kidney:  Stable 2.5 cm peripelvic cyst  Abdominal Aorta:  No aneurysm identified.  IMPRESSION: Gallbladder sludge, with a few tiny calculi.  No wall thickening, Murphy sign, or biliary dilitation.  No significant change form prior study.   Original Report Authenticated By: Esperanza Heir, M.D.   Dg Chest Port 1 View  09/28/2012   *RADIOLOGY REPORT*  Clinical Data: Pulmonary edema.  PORTABLE CHEST - 1 VIEW  Comparison: 09/26/2012  Findings: Suggestion of interstitial edema has resolved.  There is some atelectasis at the left lung base.  Heart size is stable and at the upper limits of normal.  No significant pleural fluid is identified.  IMPRESSION: Resolution of interstitial edema.   Original Report Authenticated By: Irish Lack, M.D.   Dg Swallowing Func-speech Pathology  10/14/2012   Lyanne Co     10/14/2012 11:23 AM Objective Swallowing Evaluation: Modified Barium Swallowing Study   Patient Details  Name: SATORU MILICH MRN: [id number] Date of Birth: 1930/08/23  Today's Date: 10/14/2012 Time: 4098-1191 SLP Time Calculation (min): 25 min  Past Medical History:  Past Medical History  Diagnosis Date  . Aortic stenosis   . Diabetes mellitus type II   . Hypertension   . Gout   . GERD (gastroesophageal reflux disease)    Past Surgical History:  Past Surgical History  Procedure Laterality Date  . Joint replacement      right knee  . Tee without cardioversion  01/15/2012    Procedure: TRANSESOPHAGEAL ECHOCARDIOGRAM (TEE);  Surgeon:  Laurey Morale, MD;  Location: [facility] ENDOSCOPY;  Service:  Cardiovascular;  Laterality: N/A;   HPI:  79 y.o. male with h/o recent hospitalization to [facility] on 7/10  for confusion and generalized weakness was discharged to SNF with  tegretol for possible focal seizures after 2 weeks stay at SNF,  he became more somnolent, decreased po intake,. He was [facility]  back [facility], underwent MRI of the brain ,  did not show any acute stroke, showed old strokes. He was found  to be in acute renal failure and hyeprnatremic. He was treated  for ARF and hypernatremia with fluids. But he remained confused  and encephalopathic.  CXR revealed Suggestion of interstitial  edema has resolved. There is some atelectasis at the left lung  base. Heart size is stable and t the upper limits of normal. No  significant pleural fluid is identified.   Question of   dysphagia/ no po intake over the last 2 to 3 decreased.  Family  is discussing about feeding tube placement.  Per records faxed  from SNF, pt. was consuming nectar thick liquids, diet texture  unknown to this therapist.      Assessment / Plan / Recommendation Clinical Impression  Dysphagia Diagnosis: Moderate oral phase dysphagia;Moderate  pharyngeal phase dysphagia Clinical impression: Objective swallow study completed to  identify impairments in swallow function, pt presents with  moderate oropharyngeal dysphagia demonstrated by oral weakness  and  pharyngeal sensory deficits. Oral phase impairments are  characterized by lingual pumping and poor bolus propulsion with  all solids and prolonged mastication  with mech soft solid trial  resulting in a delay in oral transit time. Pharygneal deficits  are characterized by delay in swallow initiation to the  valleculae with solids and pyriform sinuses with all liquids and  mild pharyngeal residuals. During cup sips of thin liquids, pt  impulsive and not responsive to cues for small sips, pt observed  with flash penetration during swallow 3x with throat clear 1x and  silent aspiration 1x secondary to sensory deficits and delay in  swallow initation. Pt not responsive to verbal and visual cues to  throat clear, cough, or swallow to reduce residuals or clear  aspirates. Pt judged safe to consume conservative diet of dys 3  (mech soft solids) and nectar thick liquids at this time with  medication crushed in puree. SLP to f/u to assess tolerance of  upgraded diet.    Treatment Recommendation  Therapy as outlined in treatment plan below    Diet Recommendation Dysphagia 3 (Mechanical Soft);Nectar-thick  liquid   Liquid Administration via: No straw Medication Administration: Crushed with puree Supervision: Full supervision/cueing for compensatory strategies Compensations: Slow rate;Small sips/bites;Follow solids with  liquid Postural Changes and/or Swallow Maneuvers: Seated upright 90  degrees;Upright 30-60 min after meal    Other  Recommendations Oral Care Recommendations: Oral care  before and after PO Other Recommendations: Order thickener from pharmacy;Prohibited  food (jello, ice cream, thin soups);Remove water pitcher   Follow Up Recommendations  Skilled Nursing facility    Frequency and Duration min 2x/week  2 weeks   Pertinent Vitals/Pain C/o of pain on "tailbone"     SLP Swallow Goals Patient will consume recommended diet without observed clinical  signs of aspiration with: Minimal assistance Swallow Study Goal #1 -  Progress: Progressing toward goal Patient will utilize recommended strategies during swallow to  increase swallowing safety with: Moderate cueing Swallow Study Goal #2 - Progress: Progressing toward goal   General HPI: 79 y.o. male with h/o recent hospitalization to  [facility] on 7/10 for confusion and generalized weakness was  discharged to SNF with tegretol for possible focal seizures after  2 weeks stay at SNF, he became more somnolent, decreased po  intake,. He was [facility] back [facility],  underwent MRI of the brain , did not show any acute stroke,  showed old strokes. He was found to be in acute renal failure and  hyeprnatremic. He was treated for ARF and hypernatremia with  fluids. But he remained confused and encephalopathic.  CXR  revealed Suggestion of interstitial edema has resolved. There is  some atelectasis at the left lung base. Heart size is stable and  t the upper limits of normal. No significant pleural fluid is  identified.   Question of  dysphagia/ no po intake over the last  2 to 3 decreased.  Family is discussing about feeding tube  placement.  Per records faxed from SNF, pt. was consuming nectar  thick liquids, diet texture unknown to this therapist. Pt  currently tolerating Dys 1 (puree) and nectar thick liquids in  acute setting, MBS to objectively evaluate potential for  advancement in diet.  Type of Study: Modified Barium Swallowing Study Reason for Referral: Objectively evaluate swallowing function Previous Swallow Assessment: BSE on 7/30 recommending NPO,  upgraded at bedside to dys 1 (puree)/nectar Diet Prior to this Study: Dysphagia 1 (puree);Nectar-thick  liquids Temperature Spikes Noted: No Respiratory Status: Supplemental O2 delivered via (comment)  (nasal cannula) History of Recent Intubation: No Behavior/Cognition: Alert;Cooperative;Pleasant  mood;Impulsive;Doesn't  follow directions Oral Cavity - Dentition: Poor condition;Missing dentition Self-Feeding  Abilities: Able to feed self;Needs assist Patient Positioning: Upright in bed Baseline Vocal Quality: Clear    Reason for Referral Objectively evaluate swallowing function   Oral Phase Oral Preparation/Oral Phase Oral Phase: Impaired Oral - Solids Oral - Puree: Lingual pumping;Delayed oral transit Oral - Mechanical Soft: Impaired mastication;Lingual  pumping;Reduced posterior propulsion;Delayed oral transit Oral - Pill: Pocketing in anterior sulcus;Reduced posterior  propulsion;Other (Comment);Delayed oral transit (Mastication of  pill )   Pharyngeal Phase Pharyngeal Phase Pharyngeal Phase: Impaired Pharyngeal - Nectar Pharyngeal - Nectar Cup: Delayed swallow initiation;Premature  spillage to pyriform sinuses;Reduced tongue base  retraction;Pharyngeal residue - valleculae Pharyngeal - Thin Pharyngeal - Thin Cup: Delayed swallow initiation;Premature  spillage to pyriform sinuses;Reduced airway/laryngeal  closure;Reduced tongue base retraction;Penetration/Aspiration  during swallow;Trace aspiration;Pharyngeal residue - valleculae Penetration/Aspiration details (thin cup): Material enters  airway, passes BELOW cords without attempt by patient to eject  out (silent aspiration);Material enters airway, remains ABOVE  vocal cords then ejected out Pharyngeal - Solids Pharyngeal - Puree: Delayed swallow initiation;Premature spillage  to valleculae;Reduced tongue base retraction;Pharyngeal residue -  valleculae;Pharyngeal residue - pyriform sinuses Pharyngeal - Mechanical Soft: Delayed swallow  initiation;Premature spillage to valleculae;Reduced tongue base  retraction;Pharyngeal residue - valleculae;Pharyngeal residue -  pyriform sinuses  Cervical Esophageal Phase    GO    Cervical Esophageal Phase Cervical Esophageal Phase: Leonarda Salon         Lyanne Co MA CCC-SLP 10/14/2012, 10:51 AM    Dg Fluoro Guide Lumbar Puncture  10/08/2012   *RADIOLOGY REPORT*  Clinical Data:Altered mental status.  LUMBAR PUNCTURE FLUORO GUIDE   Fluoroscopy Time: 0 minutes 19 seconds  Comparison: Lumbar radiographs dated 09/09/2012  Findings: After obtaining telephone permission from the patient's son, after explaining to him the purpose, procedure, and risks including bleeding, infection, headache, and medication reactions, using sterile technique and local anesthesia and fluoroscopic guidance, I performed a lumbar puncture at the L3-4 level.  21 ml of clear colorless spinal fluid was obtained for laboratory examination.  The patient tolerated the procedure well.  There were no immediate complications.  IMPRESSION: Lumbar puncture performed with no complications.   Original Report Authenticated By: Francene Boyers, M.D.    Microbiology: Recent Results (from the past 240 hour(s))  CSF CULTURE     Status: None   Collection Time    10/08/12  2:36 PM      Result Value Range Status   Specimen Description CSF   Final   Special Requests 5.5ML CSF FLUID   Final   Gram Stain     Final   Value: RARE WBC PRESENT, PREDOMINANTLY MONONUCLEAR     NO ORGANISMS SEEN     Performed at [facility]     Performed at [facility]   Culture     Final   Value: NO GROWTH 3 DAYS     Performed at [facility]   Report Status 10/12/2012 FINAL   Final  GRAM STAIN     Status: None   Collection Time    10/08/12  2:36 PM      Result Value Range Status   Specimen Description CSF   Final   Special Requests 5.5ML CSF FLUID   Final   Gram Stain     Final   Value: CYTOSPIN PREP     WBC PRESENT, PREDOMINANTLY MONONUCLEAR     NO ORGANISMS SEEN   Report Status 10/08/2012 FINAL   Final  FUNGUS CULTURE W SMEAR     Status: None   Collection Time    10/08/12  2:36 PM      Result Value Range Status   Specimen Description CSF   Final   Special Requests 5.5ML FLUID   Final   Fungal Smear     Final   Value: NO YEAST OR FUNGAL ELEMENTS SEEN     Performed at [facility]   Culture     Final   Value: CULTURE IN PROGRESS FOR FOUR WEEKS      Performed at [facility]   Report Status PENDING   Incomplete     Labs: Basic Metabolic Panel:  Recent Labs Lab 10/13/12 0400  CREATININE 0.87   Liver Function Tests: No results found for this basename: AST, ALT, ALKPHOS, BILITOT, PROT, ALBUMIN,  in the last 168 hours No results found for this basename: LIPASE, AMYLASE,  in the last 168 hours No results found for this basename: AMMONIA,  in the last 168 hours CBC:  Recent Labs Lab 10/10/12 0500 10/13/12 0400  WBC 9.7 7.9  HGB 9.4* 9.8*  HCT 27.4* 28.4*  MCV 97.2 95.3  PLT 427* 495*   Cardiac Enzymes: No results found for this basename: CKTOTAL, CKMB, CKMBINDEX, TROPONINI,  in the last 168 hours BNP: BNP (last 3 results) No results found for this basename: PROBNP,  in the last 8760 hours CBG:  Recent Labs Lab 10/14/12 1141 10/14/12 1647 10/14/12 1956 10/15/12 0004 10/15/12 0442  GLUCAP 261* 212* 280* 273* 265*       Signed:  Briany Aye, J  [facility] 10/15/2012, 11:09 AM

## 2012-10-15 NOTE — Progress Notes (Signed)
8/15   CBGs on 8/14  178-261-212-280 mg/dl    1/61   096-045 mg/dl  Recommend increasing Lantus to 30 units daily if CBGs continue greater than 180 mg/dl.  Smith Mince RN BSN CDE

## 2012-10-15 NOTE — Progress Notes (Signed)
Patient for d/c today to SNF bed at [facility]. Family and patient agreeable to this plan- plan transfer via EMS. Reece Levy, MSW [phone]

## 2012-10-15 NOTE — Progress Notes (Signed)
Report called to [facility], [phone], Minda. Will continue to monitor.

## 2012-10-25 LAB — B. BURGDORFI ANTIBODIES, CSF

## 2012-10-28 LAB — BETA ISOFORM (CREUTZFELDT-JAKOB DISEASE)

## 2012-11-04 LAB — FUNGUS CULTURE W SMEAR

## 2012-11-04 LAB — MISCELLANEOUS TEST

## 2012-11-17 ENCOUNTER — Ambulatory Visit (INDEPENDENT_AMBULATORY_CARE_PROVIDER_SITE_OTHER): Payer: Medicare Other | Admitting: Diagnostic Neuroimaging

## 2012-11-17 ENCOUNTER — Encounter: Payer: Self-pay | Admitting: Diagnostic Neuroimaging

## 2012-11-17 VITALS — BP 122/74 | HR 102

## 2012-11-17 DIAGNOSIS — G9340 Encephalopathy, unspecified: Secondary | ICD-10-CM

## 2012-11-17 DIAGNOSIS — F0390 Unspecified dementia without behavioral disturbance: Secondary | ICD-10-CM

## 2012-11-17 NOTE — Progress Notes (Signed)
[facility]  PATIENT: Shaun Whitney DOB: Apr 12, 1930  REFERRING CLINICIAN: Franchot Mimes HISTORY FROM: patient's son REASON FOR VISIT: new consult   HISTORICAL  CHIEF COMPLAINT:  Chief Complaint  Patient presents with  . Cerebrovascular Accident    HISTORY OF PRESENT ILLNESS:   79 year old male here for evaluation of encephalopathy, confusion, aggressive decline. Patient accompanied by his son.  Over past 3-4 years patient has had mild and gradual progressive cognitive decline. He was having difficulty with driving and taking care of himself. In retrospect he was having difficulty paying bills and maintaining finances.  April 2014 patient woke up one morning and had difficulty walking. He also had difficulty with swallowing and standing. He was evaluated by PCP, had MRI of the brain which showed an acute right thalamic stroke.  July 2014 patient had an episode of face and arm twitching. He had a rapid decline and was admitted to the hospital. He was found to have acute renal failure, hypernatremia, elevated white blood cell count. Neurology consultation with extensive blood testing, CT scan, MRI, spinal fluid analysis, was unremarkable. Patient was treated him. 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 Patient may have had some slight improvement initially but then continued to decline. Paraneoplastic panel was ordered in the hospital and results are now available which shows borderline elevation in one marker (anti-NR1), otherwise negative.  Patient was discharged to skilled nursing facility. Since that time patient continues to have progressive decline. He's barely able to talk. He's not eating. He is restless, fidgety during. He is developing decubitus ulcers on his heels and sacral region.   REVIEW OF SYSTEMS: Full 14 system review of systems performed and notable only for weight loss trouble  swallowing snoring increased thirst easy bruising confusion weakness slurred speech difficulty sewing tremor sleepiness decreased energy change in appetite.  ALLERGIES: No Known Allergies  HOME MEDICATIONS: Outpatient Prescriptions Prior to Visit  Medication Sig Dispense Refill  . allopurinol (ZYLOPRIM) 300 MG tablet Take 300 mg by mouth daily.      Marland Kitchen aspirin EC 81 MG tablet Take 81 mg by mouth daily.      . bisacodyl (DULCOLAX) 10 MG suppository Place 10 mg rectally daily as needed for constipation.      . carvedilol (COREG) 12.5 MG tablet Take 12.5 mg by mouth 2 (two) times daily.      . insulin aspart (NOVOLOG) 100 UNIT/ML injection Inject 6 Units into the skin 3 (three) times daily with meals.  1 vial  12  . insulin detemir (LEVEMIR) 100 UNIT/ML injection Inject 0.25 mL (25 Units total) into the skin at bedtime.  10 mL  12  . lisinopril (PRINIVIL,ZESTRIL) 40 MG tablet Take 1 tablet (40 mg total) by mouth daily.  90 tablet  3  . magnesium hydroxide (MILK OF MAGNESIA) 400 MG/5ML suspension Take 30 mLs by mouth daily as needed for constipation.      . metFORMIN (GLUCOPHAGE) 500 MG tablet Take 500 mg by mouth 2 (two) times daily with a meal.       . omeprazole (PRILOSEC) 20 MG capsule Take 1 capsule by mouth 2 (two) times daily.       . predniSONE (DELTASONE) 5 MG tablet Take 1 tablet (5 mg total) by mouth daily with breakfast.  60 tablet  0  . promethazine (PHENERGAN) 25 MG suppository Place 25 mg rectally every 4 (four) hours as needed for nausea.      Marland Kitchen  Rivaroxaban (XARELTO) 20 MG TABS tablet Take 1 tablet (20 mg total) by mouth daily with supper.  90 tablet  3  . Rivaroxaban (XARELTO) 15 MG TABS tablet Take 1 tablet (15 mg total) by mouth 2 (two) times daily with a meal. Stop date 10/22/2012; then we'll start some Xarelto 20 mg daily  30 tablet  0   No facility-administered medications prior to visit.    PAST MEDICAL HISTORY: Past Medical History  Diagnosis Date  . Aortic stenosis     . Diabetes mellitus type II   . Hypertension   . Gout   . GERD (gastroesophageal reflux disease)     PAST SURGICAL HISTORY: Past Surgical History  Procedure Laterality Date  . Joint replacement      right knee  . Tee without cardioversion  01/15/2012    Procedure: TRANSESOPHAGEAL ECHOCARDIOGRAM (TEE);  Surgeon: Laurey Morale, MD;  Location: [facility] ENDOSCOPY;  Service: Cardiovascular;  Laterality: N/A;    FAMILY HISTORY: History reviewed. No pertinent family history.  SOCIAL HISTORY:  History   Social History  . Marital Status: Married    Spouse Name: [name]    Number of Children: 3  . Years of Education: N/A   Occupational History  . Not on file.   Social History Main Topics  . Smoking status: Former Smoker -- 1.00 packs/day for 30 years    Types: Cigarettes    Start date: 03/04/1951    Quit date: 03/03/1981  . Smokeless tobacco: Never Used  . Alcohol Use: No  . Drug Use: No  . Sexual Activity: Not on file   Other Topics Concern  . Not on file   Social History Narrative   Patient lives at [facility].   Caffeine Use: none     PHYSICAL EXAM  Filed Vitals:   11/17/12 0956  BP: 122/74  Pulse: 102    Not recorded    Cannot calculate BMI with a height equal to zero.  GENERAL EXAM: PATIENT IS ON STRETCHER, RESTLESS, PICKING AT SHEETS, MOANING.   CARDIOVASCULAR: Regular rate and rhythm, SYSTOLIC MURMUR. no carotid bruits  NEUROLOGIC: MENTAL STATUS: awake, alert, DECR FLUENCY. FOLLOWS SOME COMMANDS INTERMITTENTLY. ABLE TO SAY HIS NAME AND HIS SON'S NAME. CANNOT COUNT FINGERS. POSITIVE SNOUT, MYERSONS AND PALMOMENTAL REFLEXES; POSITIVE GRASP REFLEX. CRANIAL NERVE: pupils equal and reactive to light, visual fields full to confrontation, extraocular muscles intact, no nystagmus, facial sensation and strength symmetric, uvula midline, shoulder shrug symmetric, tongue midline. MOTOR: PARATONIA IN BUE. DECR TONE IN BLE. BUE 4/5. BLE 1-2/5.   SENSORY: normal and symmetric to light touch COORDINATION: finger-nose-finger, fine finger movements SLOW; POSTURAL TREMOR REFLEXES: deep tendon reflexes present and symmetric   DIAGNOSTIC DATA (LABS, IMAGING, TESTING) - I reviewed patient records, labs, notes, testing and imaging myself where available.  Lab Results  Component Value Date   WBC 7.9 10/13/2012   HGB 9.8* 10/13/2012   HCT 28.4* 10/13/2012   MCV 95.3 10/13/2012   PLT 495* 10/13/2012      Component Value Date/Time   NA 142 10/08/2012 0500   K 3.9 10/08/2012 0500   CL 106 10/08/2012 0500   CO2 24 10/08/2012 0500   GLUCOSE 153* 10/08/2012 0500   BUN 20 10/08/2012 0500   CREATININE 0.87 10/13/2012 0400   CALCIUM 8.9 10/08/2012 0500   PROT 6.3 10/01/2012 0516   ALBUMIN 1.8* 10/01/2012 0516   AST 38* 10/01/2012 0516   ALT 32 10/01/2012 0516   ALKPHOS 301* 10/01/2012 0516  BILITOT 2.0* 10/01/2012 0516   GFRNONAA 79* 10/13/2012 0400   GFRAA >90 10/13/2012 0400   No results found for this basename: CHOL, HDL, LDLCALC, LDLDIRECT, TRIG, CHOLHDL   Lab Results  Component Value Date   HGBA1C 6.5* 09/29/2012   Lab Results  Component Value Date   VITAMINB12 1008* 09/28/2012   Lab Results  Component Value Date   TSH 2.528 10/06/2012   10/06/12 TSH 2.528, RPR neg  10/06/12 ESR 139 (h)  10/07/12 MRI BRAIN - moderate peri-rolandic, perisylvian and mesial temporal atrophy.   10/07/12 anti TPO 21, anti-thyroglobulin <21 (both normal)  10/08/12 CSF - WBC 1, RBC 138, glucose 81, protein 20, Lyme neg, HSV1/2 neg,   10/08/12 CSF OCB (>5 well defined gamma restriction bands that are also present in the patient's corresponding serum sample, but some bands in the CSF are more prominent. This pattern is associated with Guillain-Barre's syndrome, peripheral neuropathy or increased permeability of the blood-brain barrier secondary to infection or trauma.)  10/11/12 paraneoplastic panel - anti-NR1: Borderline levels of antibodies detected    ASSESSMENT AND  PLAN  79 y.o. year old male here with progressive cognitive, physical decline over 3-4 years, but more severe since April and July 2014. 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 No definite treatable, reversible causes found in spite of extensive testing. Empiric steroids did not help.  PLAN: 1. Palliative care consult 2. Recommend DNR status, and son agrees but will discuss with family first  Return for return to PCP.    Suanne Marker, MD 11/17/2012, 11:12 AM Certified in Neurology, Neurophysiology and Neuroimaging  [facility] [address] [place], [place] [address] [phone]

## 2012-12-01 DEATH — deceased

## 2013-03-10 ENCOUNTER — Telehealth: Payer: Self-pay | Admitting: Cardiology

## 2013-03-10 NOTE — Telephone Encounter (Signed)
Just spoke with patient's wife and she informed me that he passed away Sept 28, 2014.

## 2014-04-21 IMAGING — CR DG WRIST COMPLETE 3+V*L*
4 series · 4 of 4 positions shown · non-contrast
Comparison: None

CLINICAL DATA: Left wrist pain

LEFT WRIST - COMPLETE 3+ VIEW

[view not recorded (1 of 4)]
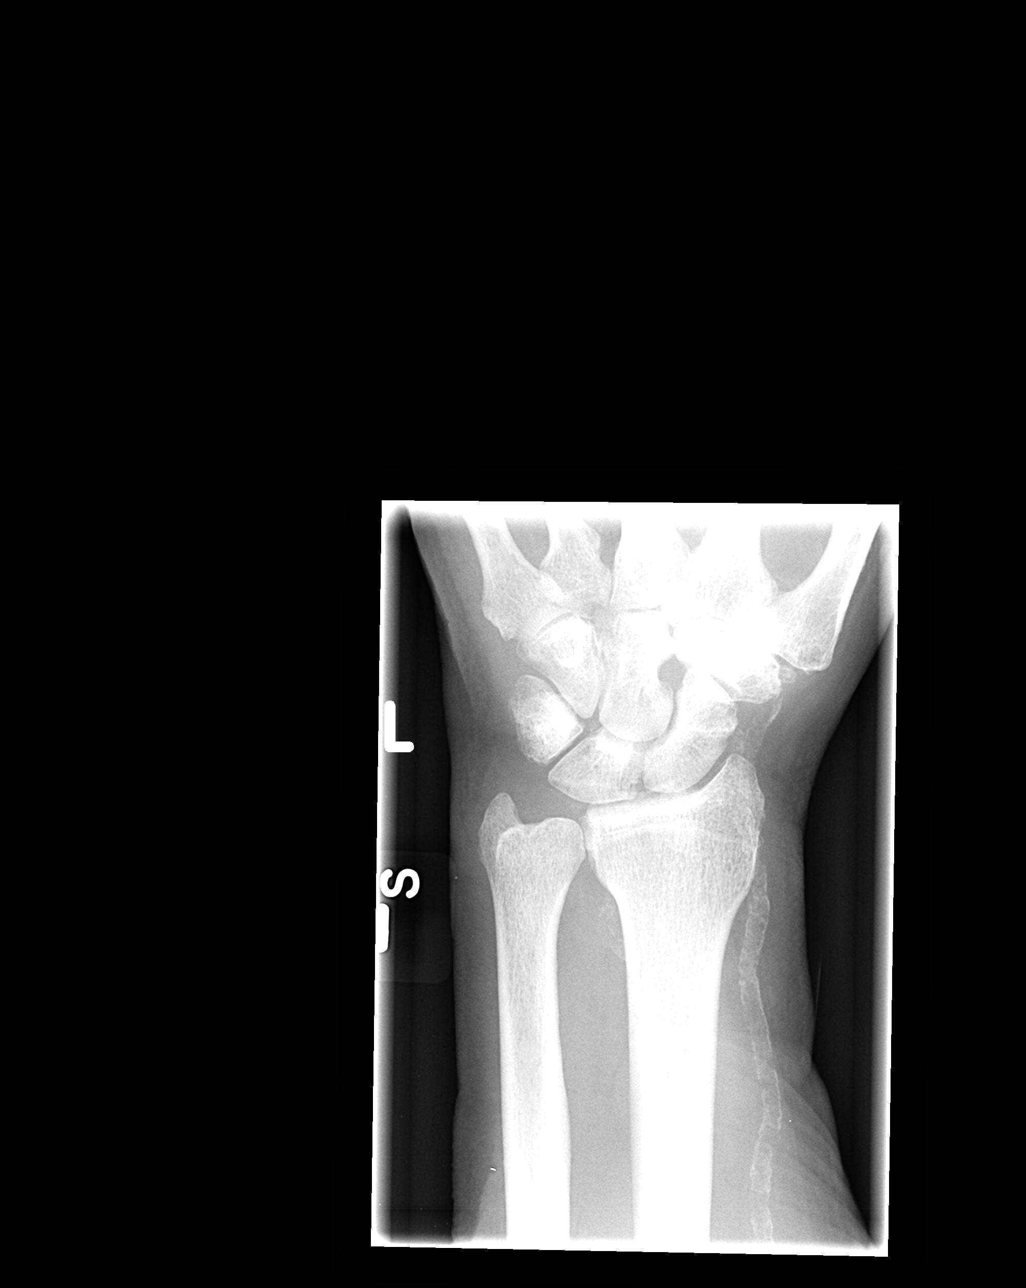

[view not recorded (2 of 4)]
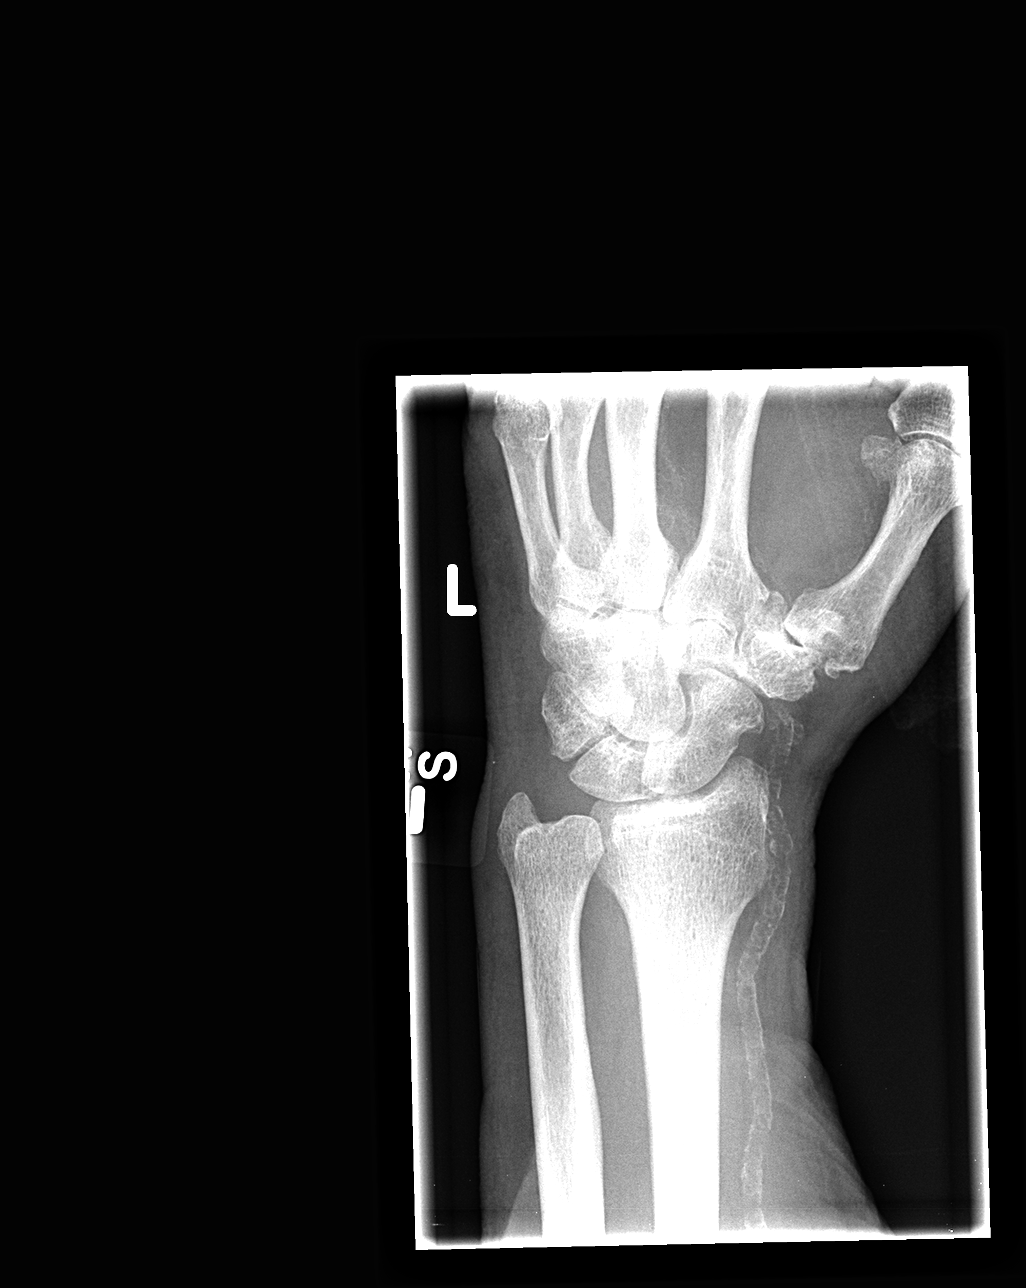

[view not recorded (3 of 4)]
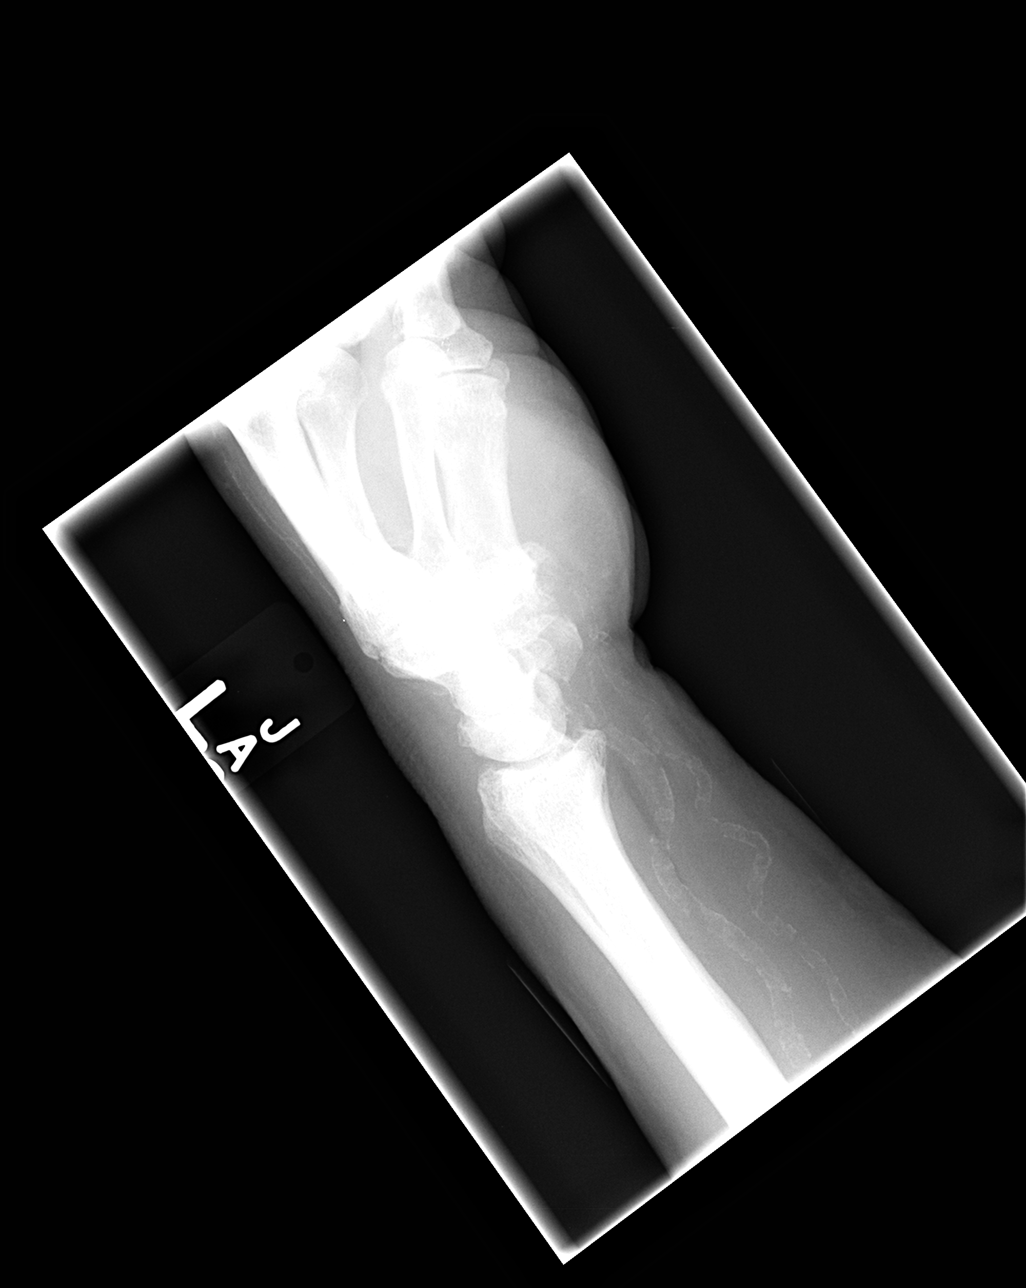

[view not recorded (4 of 4)]
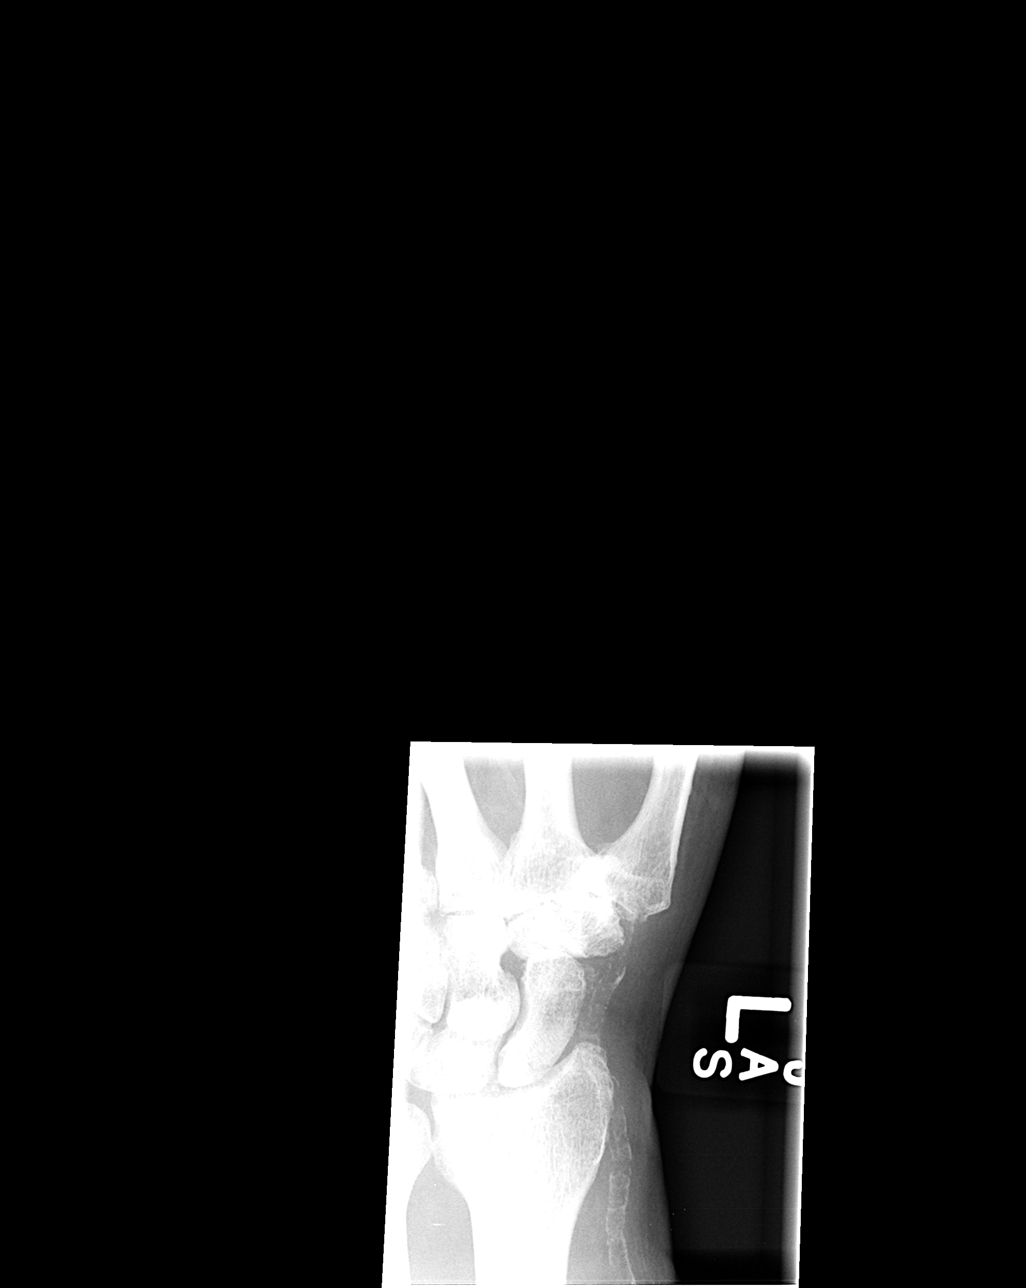

[4 of 4 positions shown; findings below may reference images not displayed]

FINDINGS: There is no evidence of acute fracture, subluxation or
dislocation.
Mild degenerative changes of the radiocarpal and first
carpometacarpal joints noted
Vascular calcifications are present.
No focal bony lesions are identified.
IMPRESSION: No acute bony abnormalities.

Mild degenerative changes.
